# Patient Record
Sex: Female | Born: 1945 | Race: White | Hispanic: No | Marital: Married | Smoking: Never smoker
Health system: Southern US, Community
[De-identification: ages and names within clinical notes are randomized; demographics above are authoritative.]

## PROBLEM LIST (undated history)

## (undated) DIAGNOSIS — M810 Age-related osteoporosis without current pathological fracture: Secondary | ICD-10-CM

## (undated) DIAGNOSIS — C801 Malignant (primary) neoplasm, unspecified: Secondary | ICD-10-CM

## (undated) DIAGNOSIS — I959 Hypotension, unspecified: Secondary | ICD-10-CM

## (undated) DIAGNOSIS — Z9109 Other allergy status, other than to drugs and biological substances: Secondary | ICD-10-CM

## (undated) DIAGNOSIS — E538 Deficiency of other specified B group vitamins: Secondary | ICD-10-CM

## (undated) DIAGNOSIS — E785 Hyperlipidemia, unspecified: Secondary | ICD-10-CM

## (undated) DIAGNOSIS — E559 Vitamin D deficiency, unspecified: Secondary | ICD-10-CM

## (undated) HISTORY — PX: APPENDECTOMY: SHX54

## (undated) HISTORY — PX: TUBAL LIGATION: SHX77

---

## 1983-01-02 DIAGNOSIS — C801 Malignant (primary) neoplasm, unspecified: Secondary | ICD-10-CM

## 1983-01-02 HISTORY — PX: MASTECTOMY: SHX3

## 1983-01-02 HISTORY — DX: Malignant (primary) neoplasm, unspecified: C80.1

## 1984-01-02 HISTORY — PX: AUGMENTATION MAMMAPLASTY: SUR837

## 2003-10-25 ENCOUNTER — Ambulatory Visit: Payer: Self-pay | Admitting: Obstetrics and Gynecology

## 2004-11-13 ENCOUNTER — Ambulatory Visit: Payer: Self-pay | Admitting: Obstetrics and Gynecology

## 2005-11-07 ENCOUNTER — Emergency Department: Payer: Self-pay | Admitting: Emergency Medicine

## 2005-12-14 ENCOUNTER — Ambulatory Visit: Payer: Self-pay | Admitting: Obstetrics and Gynecology

## 2007-01-22 ENCOUNTER — Ambulatory Visit: Payer: Self-pay | Admitting: Obstetrics and Gynecology

## 2007-08-27 ENCOUNTER — Emergency Department: Payer: Self-pay | Admitting: Emergency Medicine

## 2008-03-11 ENCOUNTER — Ambulatory Visit: Payer: Self-pay | Admitting: Obstetrics and Gynecology

## 2009-03-14 ENCOUNTER — Ambulatory Visit: Payer: Self-pay | Admitting: Internal Medicine

## 2010-04-12 ENCOUNTER — Ambulatory Visit: Payer: Self-pay | Admitting: Internal Medicine

## 2011-05-21 ENCOUNTER — Ambulatory Visit: Payer: Self-pay | Admitting: Internal Medicine

## 2012-05-21 ENCOUNTER — Ambulatory Visit: Payer: Self-pay | Admitting: Internal Medicine

## 2012-06-19 ENCOUNTER — Ambulatory Visit: Payer: Self-pay | Admitting: Unknown Physician Specialty

## 2013-05-26 ENCOUNTER — Ambulatory Visit: Payer: Self-pay | Admitting: Internal Medicine

## 2014-05-06 ENCOUNTER — Other Ambulatory Visit: Payer: Self-pay

## 2014-05-06 DIAGNOSIS — Z1231 Encounter for screening mammogram for malignant neoplasm of breast: Secondary | ICD-10-CM

## 2014-06-01 ENCOUNTER — Ambulatory Visit
Admission: RE | Admit: 2014-06-01 | Discharge: 2014-06-01 | Disposition: A | Discharge status: Home / Self Care | Payer: Medicare Other | Source: Ambulatory Visit | Attending: Internal Medicine | Admitting: Internal Medicine

## 2014-06-01 ENCOUNTER — Other Ambulatory Visit: Payer: Self-pay

## 2014-06-01 DIAGNOSIS — Z1231 Encounter for screening mammogram for malignant neoplasm of breast: Secondary | ICD-10-CM | POA: Diagnosis not present

## 2014-06-01 HISTORY — DX: Malignant (primary) neoplasm, unspecified: C80.1

## 2014-12-24 IMAGING — MG MM CAD DIAGNOSTIC MAMMO
1 series · 8 of 8 positions shown · non-contrast
Comparison: 05/21/2011, 04/12/2010.

REASON FOR EXAM: HX BR CA YRLY IMPLANTS
COMMENTS:

PROCEDURE:     MAM - MAM DGTL DIAGNOSTIC MAMMO W/CAD  - May 21, 2012  [DATE]
RESULT:

[R CC · right · 8 of 13 slices shown]
[im 1/13]
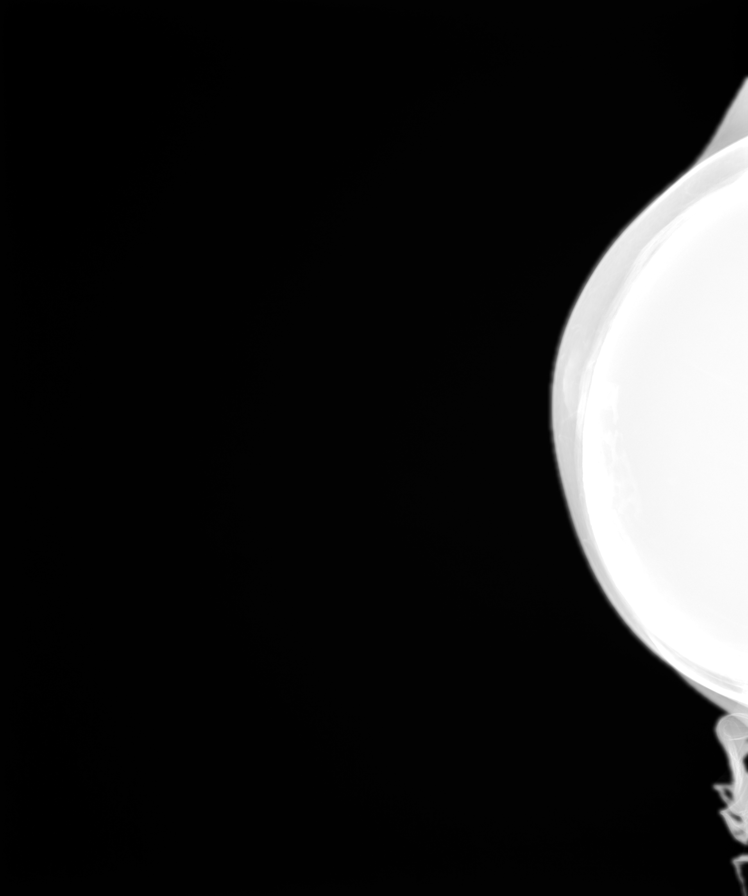
[im 2/13]
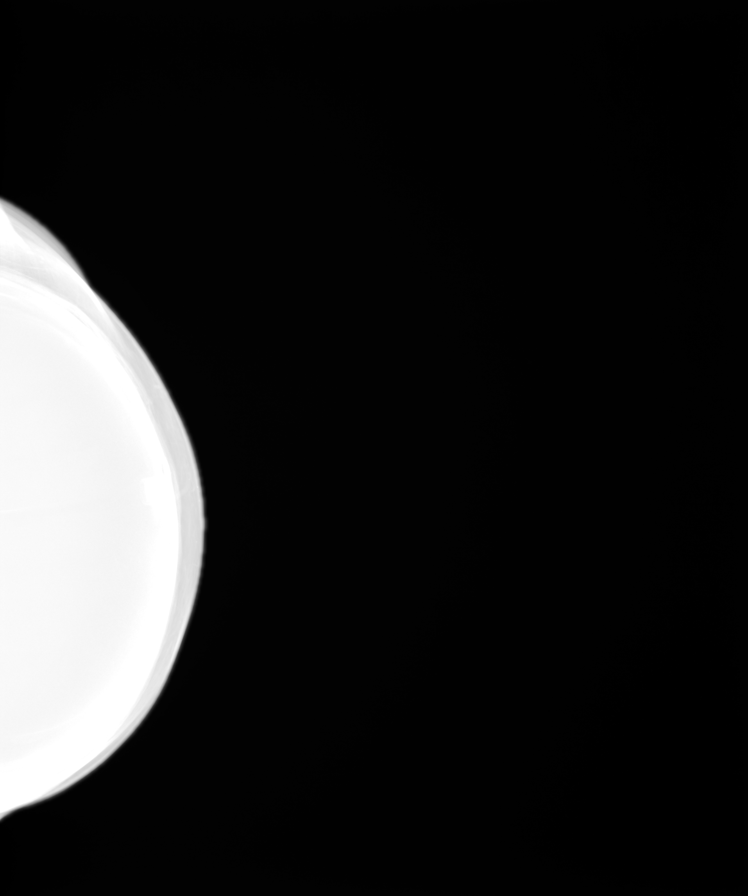
[im 4/13]
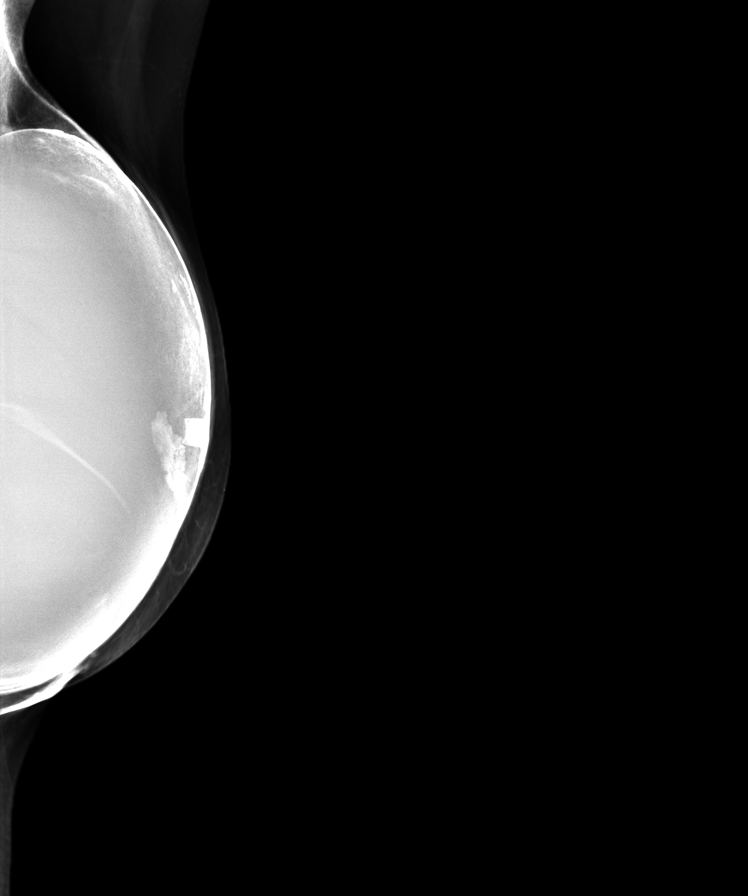
[im 6/13]
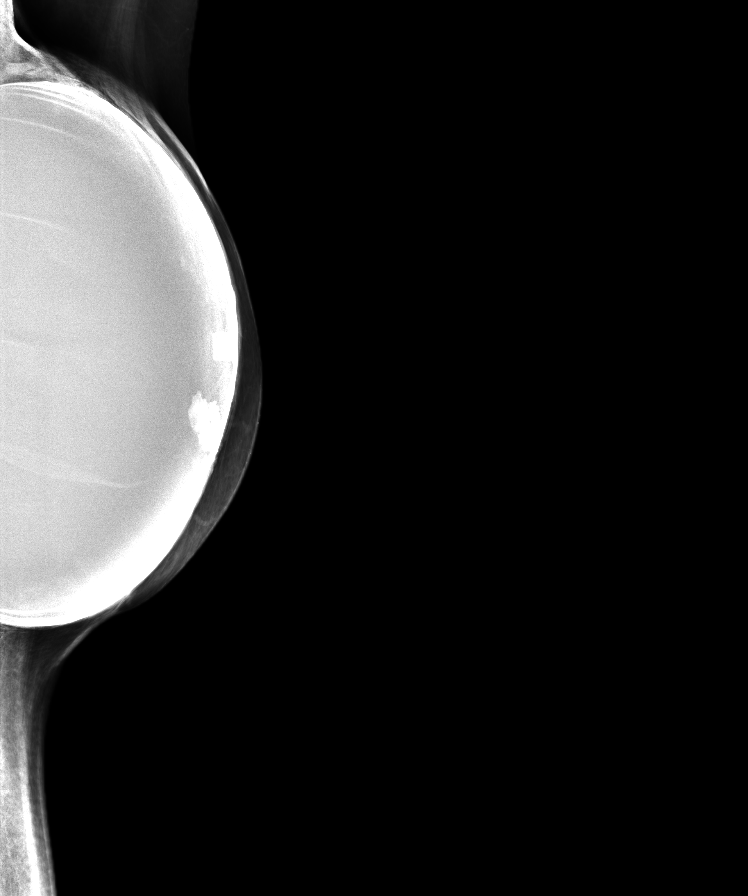
[im 7/13]
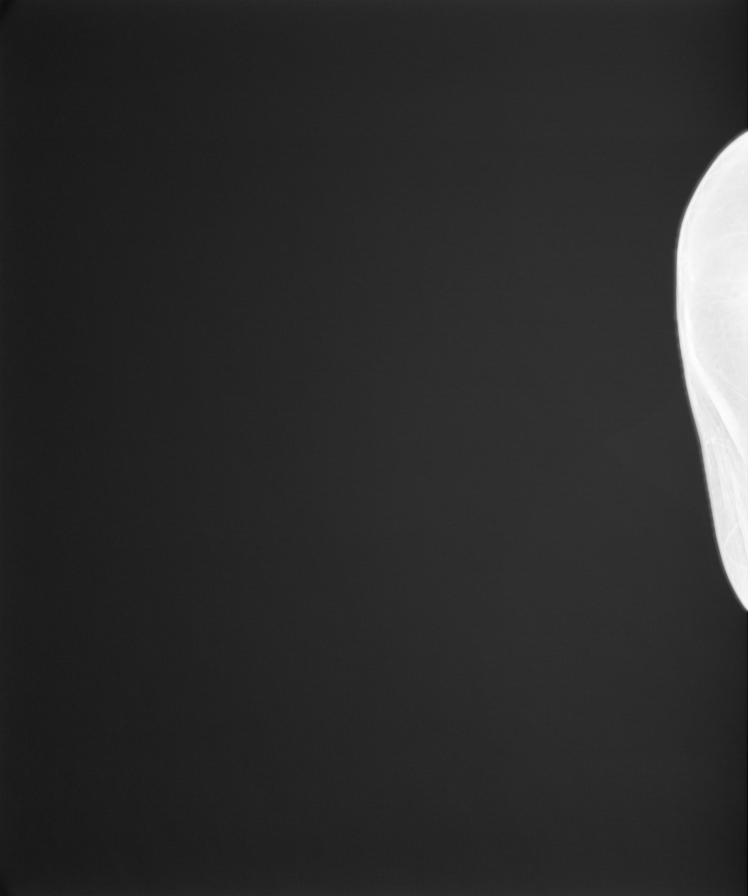
[im 9/13]
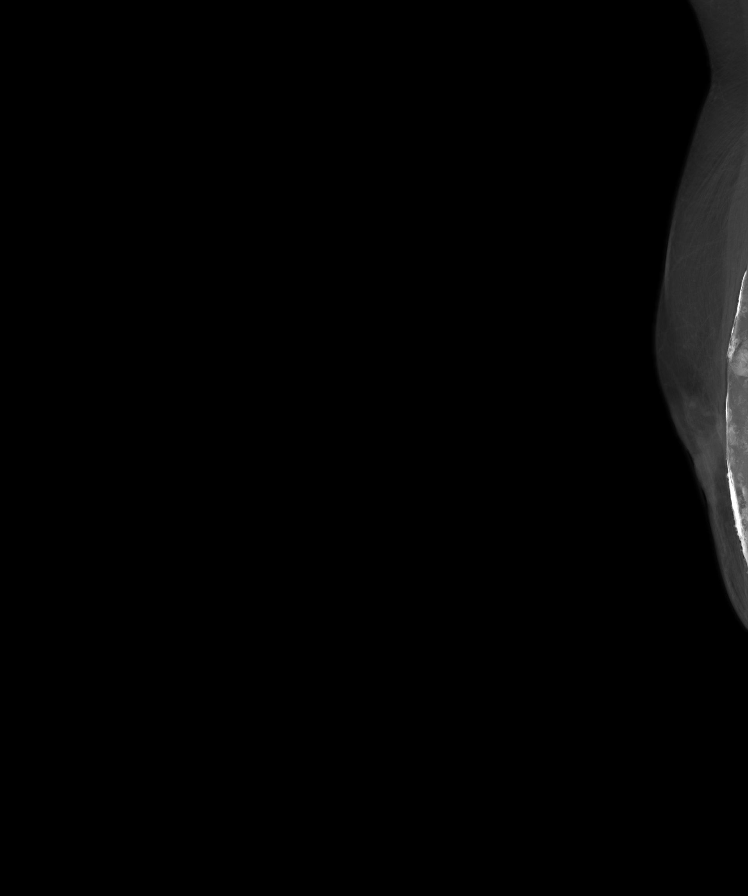
[im 11/13]
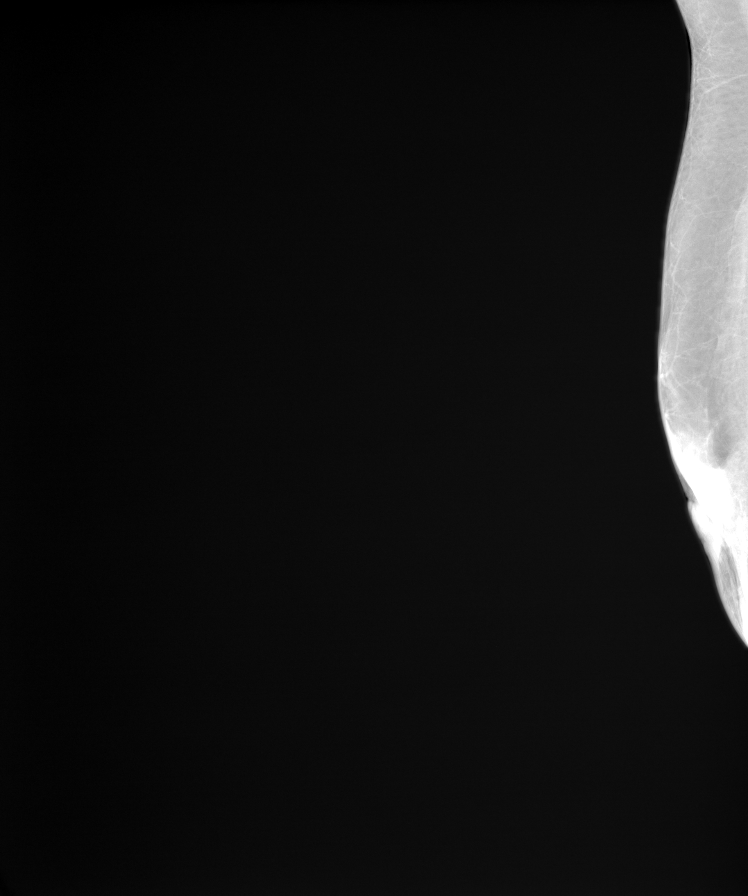
[im 13/13]
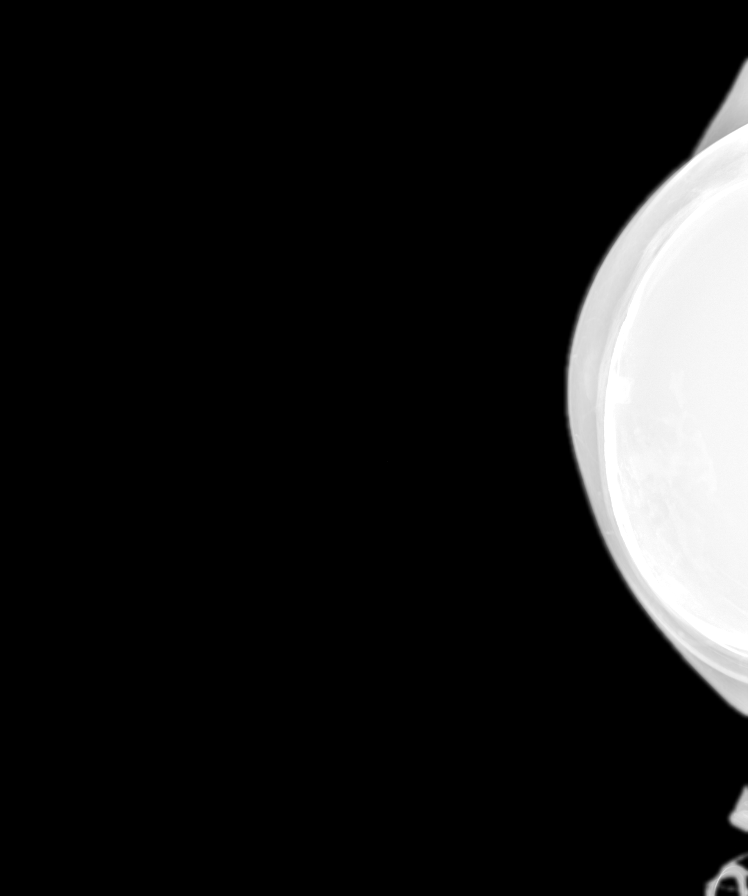

[8 of 8 positions shown; findings below may reference images not displayed]

FINDINGS: There are bilateral prosthetic breast implants, which lower the sensitivity
of mammography. There are calcifications along the capsular surface and
similar to prior studies. There is very little native breast tissue present.
The breast tissue is most entirely fatty. No suspicious masses or
calcifications are identified.
IMPRESSION: 1.     BI-RADS: Category 2 - Benign Findings.
2.     Recommend continued annual mammography.

[REDACTED]

BREAST COMPOSITION:  The breast composition is ALMOST ENTIRELY FATTY
(glandular tissue is less than 25%).

Thank you for this opportunity to contribute to the care of your patient.

A NEGATIVE MAMMOGRAM REPORT DOES NOT PRECLUDE BIOPSY OR OTHER EVALUATION OF
A CLINICALLY PALPABLE OR OTHERWISE SUSPICIOUS MASS OR LESION. BREAST CANCER
MAY NOT BE DETECTED BY MAMMOGRAPHY IN UP TO 10% OF CASES.

## 2015-05-06 ENCOUNTER — Other Ambulatory Visit: Payer: Self-pay | Admitting: Internal Medicine

## 2015-05-06 DIAGNOSIS — Z1231 Encounter for screening mammogram for malignant neoplasm of breast: Secondary | ICD-10-CM

## 2015-06-02 ENCOUNTER — Ambulatory Visit: Payer: Medicare Other

## 2015-06-09 ENCOUNTER — Other Ambulatory Visit: Payer: Self-pay | Admitting: Internal Medicine

## 2015-06-09 ENCOUNTER — Ambulatory Visit
Admission: RE | Admit: 2015-06-09 | Discharge: 2015-06-09 | Disposition: A | Discharge status: Home / Self Care | Payer: Medicare Other | Source: Ambulatory Visit | Attending: Internal Medicine | Admitting: Internal Medicine

## 2015-06-09 DIAGNOSIS — Z1231 Encounter for screening mammogram for malignant neoplasm of breast: Secondary | ICD-10-CM | POA: Diagnosis present

## 2016-05-23 ENCOUNTER — Other Ambulatory Visit: Payer: Self-pay | Admitting: Internal Medicine

## 2016-05-23 DIAGNOSIS — Z1231 Encounter for screening mammogram for malignant neoplasm of breast: Secondary | ICD-10-CM

## 2016-06-14 ENCOUNTER — Ambulatory Visit
Admission: RE | Admit: 2016-06-14 | Discharge: 2016-06-14 | Disposition: A | Discharge status: Home / Self Care | Payer: Medicare HMO | Source: Ambulatory Visit | Attending: Internal Medicine | Admitting: Internal Medicine

## 2016-06-14 DIAGNOSIS — Z1231 Encounter for screening mammogram for malignant neoplasm of breast: Secondary | ICD-10-CM | POA: Insufficient documentation

## 2017-06-19 ENCOUNTER — Other Ambulatory Visit: Payer: Self-pay | Admitting: Internal Medicine

## 2017-06-19 DIAGNOSIS — Z1231 Encounter for screening mammogram for malignant neoplasm of breast: Secondary | ICD-10-CM

## 2017-06-20 ENCOUNTER — Ambulatory Visit
Admission: RE | Admit: 2017-06-20 | Discharge: 2017-06-20 | Disposition: A | Discharge status: Home / Self Care | Payer: Medicare HMO | Source: Ambulatory Visit | Attending: Internal Medicine | Admitting: Internal Medicine

## 2017-06-20 DIAGNOSIS — Z1231 Encounter for screening mammogram for malignant neoplasm of breast: Secondary | ICD-10-CM | POA: Insufficient documentation

## 2018-05-22 ENCOUNTER — Other Ambulatory Visit: Payer: Self-pay | Admitting: Internal Medicine

## 2018-05-22 DIAGNOSIS — Z1231 Encounter for screening mammogram for malignant neoplasm of breast: Secondary | ICD-10-CM

## 2018-06-10 DIAGNOSIS — E559 Vitamin D deficiency, unspecified: Secondary | ICD-10-CM | POA: Insufficient documentation

## 2018-06-24 ENCOUNTER — Other Ambulatory Visit: Payer: Self-pay

## 2018-06-24 ENCOUNTER — Other Ambulatory Visit: Payer: Self-pay | Admitting: Internal Medicine

## 2018-06-24 ENCOUNTER — Ambulatory Visit
Admission: RE | Admit: 2018-06-24 | Discharge: 2018-06-24 | Disposition: A | Discharge status: Home / Self Care | Payer: Medicare HMO | Source: Ambulatory Visit | Attending: Internal Medicine | Admitting: Internal Medicine

## 2018-06-24 DIAGNOSIS — Z1231 Encounter for screening mammogram for malignant neoplasm of breast: Secondary | ICD-10-CM | POA: Diagnosis present

## 2018-10-27 ENCOUNTER — Ambulatory Visit: Payer: Self-pay | Admitting: Podiatry

## 2018-11-10 ENCOUNTER — Ambulatory Visit (INDEPENDENT_AMBULATORY_CARE_PROVIDER_SITE_OTHER): Payer: Medicare HMO | Admitting: Podiatry

## 2018-11-10 ENCOUNTER — Ambulatory Visit (INDEPENDENT_AMBULATORY_CARE_PROVIDER_SITE_OTHER): Payer: Medicare HMO

## 2018-11-10 ENCOUNTER — Encounter: Payer: Self-pay | Admitting: Podiatry

## 2018-11-10 ENCOUNTER — Other Ambulatory Visit: Payer: Self-pay

## 2018-11-10 VITALS — BP 125/70 | HR 66 | Resp 16

## 2018-11-10 DIAGNOSIS — M810 Age-related osteoporosis without current pathological fracture: Secondary | ICD-10-CM | POA: Insufficient documentation

## 2018-11-10 DIAGNOSIS — M778 Other enthesopathies, not elsewhere classified: Secondary | ICD-10-CM

## 2018-11-10 DIAGNOSIS — M2041 Other hammer toe(s) (acquired), right foot: Secondary | ICD-10-CM | POA: Diagnosis not present

## 2018-11-10 DIAGNOSIS — I959 Hypotension, unspecified: Secondary | ICD-10-CM | POA: Insufficient documentation

## 2018-11-10 DIAGNOSIS — Z9109 Other allergy status, other than to drugs and biological substances: Secondary | ICD-10-CM | POA: Insufficient documentation

## 2018-11-10 DIAGNOSIS — E785 Hyperlipidemia, unspecified: Secondary | ICD-10-CM | POA: Insufficient documentation

## 2018-11-10 DIAGNOSIS — M2011 Hallux valgus (acquired), right foot: Secondary | ICD-10-CM

## 2018-11-10 NOTE — Progress Notes (Signed)
  Subjective:  Patient ID: Alexis Howe, female    DOB: 10-Dec-1945,  MRN: [id number] HPI Chief Complaint  Patient presents with  . Foot Pain    Plantar forefoot right - aching x few months, 2nd toe oeverlapping big toe some, felt puffy-better today, open shoes help and wears a bandaid to separate the toes  . New Patient (Initial Visit)    73 y.o. female presents with the above complaint.   ROS: Denies fever chills nausea vomiting muscle aches pains calf pain back pain chest pain shortness of breath.  Past Medical History:  Diagnosis Date  . Cancer [facility]) 1985   left breast mastectomy with reconstruction   Past Surgical History:  Procedure Laterality Date  . AUGMENTATION MAMMAPLASTY Bilateral 1986   left complete mastectomy for breast ca. right subcutaneous mastectomy for preventative. (orginal nipple)  . MASTECTOMY Left 1985   mastectomy with reconstructive surgery    Current Outpatient Medications:  .  aspirin EC 81 MG tablet, Take 81 mg by mouth daily., Disp: , Rfl:  .  vitamin B-12 (CYANOCOBALAMIN) 1000 MCG tablet, Take 1,000 mcg by mouth daily., Disp: , Rfl:  .  lovastatin (MEVACOR) 40 MG tablet, Take 40 mg by mouth daily., Disp: , Rfl:  .  raloxifene (EVISTA) 60 MG tablet, Take 60 mg by mouth daily., Disp: , Rfl:  .  Vitamin D, Ergocalciferol, (DRISDOL) 1.25 MG (50000 UT) CAPS capsule, Take 50,000 Units by mouth once a week., Disp: , Rfl:   No Known Allergies Review of Systems Objective:   Vitals:   11/10/18 0949  BP: 125/70  Pulse: 66  Resp: 16    General: Well developed, nourished, in no acute distress, alert and oriented x3   Dermatological: Skin is warm, dry and supple bilateral. Nails x 10 are well maintained; remaining integument appears unremarkable at this time. There are no open sores, no preulcerative lesions, no rash or signs of infection present.  Vascular: Dorsalis Pedis artery and Posterior Tibial artery pedal pulses are 2/4 bilateral with immedate  capillary fill time. Pedal hair growth present. No varicosities and no lower extremity edema present bilateral.   Neruologic: Grossly intact via light touch bilateral. Vibratory intact via tuning fork bilateral. Protective threshold with Semmes Wienstein monofilament intact to all pedal sites bilateral. Patellar and Achilles deep tendon reflexes 2+ bilateral. No Babinski or clonus noted bilateral.   Musculoskeletal: No gross boney pedal deformities bilateral. No pain, crepitus, or limitation noted with foot and ankle range of motion bilateral. Muscular strength 5/5 in all groups tested bilateral.  Gait: Unassisted, Nonantalgic.    Radiographs:  Radiographs taken today demonstrate increase in the first intermetatarsal angle elongated second metatarsal and hammertoe deformity.  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    Plan: After sterile Betadine skin prep I injected 10 mg Kenalog 5 mg Marcaine around the second metatarsophalangeal joint of the right foot.  Discussed surgery in great detail today.  We will follow-up with her in the near future for surgical consult.     Jamerson Vonbargen T. [place], [place]

## 2018-12-22 ENCOUNTER — Ambulatory Visit: Payer: Medicare HMO | Admitting: Podiatry

## 2018-12-22 ENCOUNTER — Encounter: Payer: Self-pay | Admitting: Podiatry

## 2018-12-22 ENCOUNTER — Other Ambulatory Visit: Payer: Self-pay

## 2018-12-22 DIAGNOSIS — M2041 Other hammer toe(s) (acquired), right foot: Secondary | ICD-10-CM

## 2018-12-22 DIAGNOSIS — M21961 Unspecified acquired deformity of right lower leg: Secondary | ICD-10-CM

## 2018-12-22 DIAGNOSIS — M2011 Hallux valgus (acquired), right foot: Secondary | ICD-10-CM | POA: Diagnosis not present

## 2018-12-22 NOTE — Progress Notes (Signed)
She presents today for follow-up of capsulitis second metatarsophalangeal joint of the right foot.  States that is feeling a lot better but I think is going to have to have surgery to get it fixed permanently.  She denies history medical history changes med changes.  ROS: Denies fever chills nausea vomiting muscle aches pains calf pain back pain chest pain shortness of breath.  Objective: Vital signs are stable she is alert and oriented x3.  Hallux abductovalgus deformity of the right foot is resulting in hammertoe deformity of the right foot second toe.  Neurologic sensorium is intact deep tendon reflexes are intact muscle strength is normal and symmetrical.  Dermatologic evaluation is normal.  Assessment: Pain in limb secondary to hallux valgus deformity right contracted second metatarsophalangeal joint.  Plan: Discussed etiology pathology conservative surgical therapies.  Discussed Austin bunionectomy and second metatarsal osteotomy and hammertoe repair with screw and or pin second toe right foot.  Answered all the questions regarding this procedure to the best my ability in layman's terms she understood was amenable to it and signed all 3 pages of the consent form.  Provided her with information regarding the surgery center provided her with information regarding anesthesia group we also provide her with instructions for the morning of surgery.

## 2018-12-22 NOTE — Patient Instructions (Signed)
Pre-Operative Instructions  Congratulations, you have decided to take an important step towards improving your quality of life.  You can be assured that the doctors and staff at [facility] will be with you every step of the way.  Here are some important things you should know:  1. Plan to be at the surgery center/hospital at least 1 (one) hour prior to your scheduled time, unless otherwise directed by the surgical center/hospital staff.  You must have a responsible adult accompany you, remain during the surgery and drive you home.  Make sure you have directions to the surgical center/hospital to ensure you arrive on time. 2. If you are having surgery at [facility] or [facility] hospitals, you will need a copy of your medical history and physical form from your family physician within one month prior to the date of surgery. We will give you a form for your primary physician to complete.  3. We make every effort to accommodate the date you request for surgery.  However, there are times where surgery dates or times have to be moved.  We will contact you as soon as possible if a change in schedule is required.   4. No aspirin/ibuprofen for one week before surgery.  If you are on aspirin, any non-steroidal anti-inflammatory medications (Mobic, Aleve, Ibuprofen) should not be taken seven (7) days prior to your surgery.  You make take Tylenol for pain prior to surgery.  5. Medications - If you are taking daily heart and blood pressure medications, seizure, reflux, allergy, asthma, anxiety, pain or diabetes medications, make sure you notify the surgery center/hospital before the day of surgery so they can tell you which medications you should take or avoid the day of surgery. 6. No food or drink after midnight the night before surgery unless directed otherwise by surgical center/hospital staff. 7. No alcoholic beverages 24-hours prior to surgery.  No smoking 24-hours prior or 24-hours after  surgery. 8. Wear loose pants or shorts. They should be loose enough to fit over bandages, boots, and casts. 9. Don't wear slip-on shoes. Sneakers are preferred. 10. Bring your boot with you to the surgery center/hospital.  Also bring crutches or a walker if your physician has prescribed it for you.  If you do not have this equipment, it will be provided for you after surgery. 11. If you have not been contacted by the surgery center/hospital by the day before your surgery, call to confirm the date and time of your surgery. 12. Leave-time from work may vary depending on the type of surgery you have.  Appropriate arrangements should be made prior to surgery with your employer. 13. Prescriptions will be provided immediately following surgery by your doctor.  Fill these as soon as possible after surgery and take the medication as directed. Pain medications will not be refilled on weekends and must be approved by the doctor. 14. Remove nail polish on the operative foot and avoid getting pedicures prior to surgery. 15. Wash the night before surgery.  The night before surgery wash the foot and leg well with water and the antibacterial soap provided. Be sure to pay special attention to beneath the toenails and in between the toes.  Wash for at least three (3) minutes. Rinse thoroughly with water and dry well with a towel.  Perform this wash unless told not to do so by your physician.  Enclosed: 1 Ice pack (please put in freezer the night before surgery)   1 Hibiclens skin cleaner     Pre-op instructions  If you have any questions regarding the instructions, please do not hesitate to call our office.  [place]: [address], [place], [place] [address] -- [phone]  [place]: [address] [place], [place] [address] -- [phone]  [place]: [address], [place], [place] [address] -- [phone]   Website: [email or web address] 

## 2018-12-23 ENCOUNTER — Telehealth: Payer: Self-pay | Admitting: Podiatry

## 2018-12-23 NOTE — Telephone Encounter (Signed)
Pt called to say she was getting ready to leave and didn't want me to try calling since she wouldn't be home. I asked if she wanted to go ahead and schedule her surgery while we were on the phone. I scheduled the pt for surgery on 01/23/2019. I told her she could go ahead register online with the surgical center and that they would call a day or two prior to let her know what time to arrive for surgery. Told pt to call with any questions between now and her surgery.

## 2018-12-23 NOTE — Telephone Encounter (Signed)
I'm calling to schedule surgery on my right foot with Dr. Milinda Pointer.

## 2019-01-06 ENCOUNTER — Telehealth: Payer: Self-pay | Admitting: Podiatry

## 2019-01-06 NOTE — Telephone Encounter (Signed)
DOS: 01/23/2019  SURGICAL PROCEDURES: Alexis Howe [id number]([id number]), Metatarsal Osteotomy 2nd [phone]), and Hammertoe Repair 2nd [phone]).  [employer] Medicare Effective 01/02/2016 -   There is no deductible on this plan. Out of Pocket is $7,000 with $0 met and $7,000 to meet. CoInsurance is 100% / 0% Copay is $375.  Per Automated Eligibility no referral is required. Call ref# [id number].  Per [name] no prior authorization is required. Call ref# [id number].

## 2019-01-22 ENCOUNTER — Other Ambulatory Visit: Payer: Self-pay | Admitting: Podiatry

## 2019-01-22 MED ORDER — OXYCODONE-ACETAMINOPHEN 10-325 MG PO TABS: 1.0000 | ORAL_TABLET | Freq: Three times a day (TID) | ORAL | 0 refills | Status: AC | PRN

## 2019-01-22 MED ORDER — CEPHALEXIN 500 MG PO CAPS: 500.0000 mg | ORAL_CAPSULE | Freq: Three times a day (TID) | ORAL | 0 refills | Status: DC

## 2019-01-22 MED ORDER — ONDANSETRON HCL 4 MG PO TABS: 4.0000 mg | ORAL_TABLET | Freq: Three times a day (TID) | ORAL | 0 refills | Status: DC | PRN

## 2019-01-23 ENCOUNTER — Encounter: Payer: Self-pay | Admitting: Podiatry

## 2019-01-23 DIAGNOSIS — M21541 Acquired clubfoot, right foot: Secondary | ICD-10-CM

## 2019-01-23 DIAGNOSIS — M2011 Hallux valgus (acquired), right foot: Secondary | ICD-10-CM

## 2019-01-23 DIAGNOSIS — M2041 Other hammer toe(s) (acquired), right foot: Secondary | ICD-10-CM

## 2019-01-28 ENCOUNTER — Ambulatory Visit (INDEPENDENT_AMBULATORY_CARE_PROVIDER_SITE_OTHER): Payer: Medicare HMO

## 2019-01-28 ENCOUNTER — Other Ambulatory Visit: Payer: Self-pay

## 2019-01-28 ENCOUNTER — Ambulatory Visit (INDEPENDENT_AMBULATORY_CARE_PROVIDER_SITE_OTHER): Payer: Medicare HMO | Admitting: Podiatry

## 2019-01-28 ENCOUNTER — Encounter: Payer: Medicare HMO | Admitting: Podiatry

## 2019-01-28 ENCOUNTER — Encounter: Payer: Self-pay | Admitting: Podiatry

## 2019-01-28 VITALS — BP 121/68 | HR 76 | Temp 98.2°F

## 2019-01-28 DIAGNOSIS — Z9889 Other specified postprocedural states: Secondary | ICD-10-CM

## 2019-01-28 DIAGNOSIS — M2011 Hallux valgus (acquired), right foot: Secondary | ICD-10-CM

## 2019-01-28 NOTE — Progress Notes (Signed)
She presents today postop visit date of surgery January 23, 2019 status post Liane Comber bunionectomy second metatarsal osteotomy and hammertoe repair second right.  She denies fever chills nausea vomiting muscle aches pains states she is doing very well.  Has had hardly any pain continues to take ibuprofen and Tylenol together.  Objective: Vital signs are stable she alert oriented x3 presents today in her cam walker dry sterile dressing intact was removed demonstrates minimal edema some ecchymosis no erythema cellulitis drainage or odor sutures are intact margins well coapted great range of motion of the first metatarsophalangeal joint.  Radiographs taken today demonstrate a capital osteotomy of the first and second metatarsals with screw fixation as well as a prescription pin retained to the second toe just to the level of the metatarsophalangeal joint sitting in a vertical or rectus position.  Assessment: Well-healing surgical foot.  Plan: Redressed today dressed a compressive dressing encouraged to continue current therapies and I will follow-up with her in 1 week.

## 2019-02-04 ENCOUNTER — Other Ambulatory Visit: Payer: Self-pay

## 2019-02-04 ENCOUNTER — Ambulatory Visit (INDEPENDENT_AMBULATORY_CARE_PROVIDER_SITE_OTHER): Payer: Medicare HMO | Admitting: Podiatry

## 2019-02-04 ENCOUNTER — Encounter: Payer: Self-pay | Admitting: Podiatry

## 2019-02-04 DIAGNOSIS — M21961 Unspecified acquired deformity of right lower leg: Secondary | ICD-10-CM

## 2019-02-04 DIAGNOSIS — Z9889 Other specified postprocedural states: Secondary | ICD-10-CM

## 2019-02-04 DIAGNOSIS — M2041 Other hammer toe(s) (acquired), right foot: Secondary | ICD-10-CM

## 2019-02-04 DIAGNOSIS — M2011 Hallux valgus (acquired), right foot: Secondary | ICD-10-CM

## 2019-02-04 NOTE — Progress Notes (Signed)
She presents today date of surgery January 23, 2019 status post Liane Comber bunionectomy right leg metatarsal osteotomy right hammertoe repair second right she states that is doing great my toes are great I have no pain whatsoever.  Objective: Vital signs are stable alert oriented x3.  There is no erythema just mild edema no cellulitis drainage or odor sutures are intact we will leave the sutures in for about another week.  There is a mild elevatus of the second toe which I would like to see come down.  Assessment: She is doing very well for nearly 2 weeks out at this point.  K wires still in the toe were going to leave it in for a while longer.  Plan: Redressed the foot today dressed a compressive dressing follow-up with her in 1 week for suture removal.

## 2019-02-11 ENCOUNTER — Other Ambulatory Visit: Payer: Self-pay

## 2019-02-11 ENCOUNTER — Ambulatory Visit (INDEPENDENT_AMBULATORY_CARE_PROVIDER_SITE_OTHER): Payer: Medicare HMO | Admitting: Podiatry

## 2019-02-11 ENCOUNTER — Ambulatory Visit (INDEPENDENT_AMBULATORY_CARE_PROVIDER_SITE_OTHER): Payer: Medicare HMO

## 2019-02-11 DIAGNOSIS — M2041 Other hammer toe(s) (acquired), right foot: Secondary | ICD-10-CM | POA: Diagnosis not present

## 2019-02-11 DIAGNOSIS — Z9889 Other specified postprocedural states: Secondary | ICD-10-CM

## 2019-02-11 DIAGNOSIS — M21961 Unspecified acquired deformity of right lower leg: Secondary | ICD-10-CM | POA: Diagnosis not present

## 2019-02-11 DIAGNOSIS — M2011 Hallux valgus (acquired), right foot: Secondary | ICD-10-CM

## 2019-02-11 NOTE — Progress Notes (Signed)
She presents today for her third postop visit date of surgery 01/23/1999 status post Alexis Howe bunionectomy second metatarsal osteotomy hammertoe repair with pin second digit right foot states that I am doing very well nothing has even hurt I been keeping it elevated is much as possible.  I enjoyed this move much better than it did the big boot.  Objective: Vital signs are stable she is alert and oriented x3.  K wires second digit is intact toe is slightly elevated she has good range of motion of the first metatarsophalangeal joint sutures are intact margins are well coapted particularly overlying the incision to that first metatarsal overlying the second metatarsal little bit less healed so I think were going to wait a little longer.  Assessment: Well-healing surgical foot.  Plan: Follow-up with her in 1 week redressed today dressed a compressive dressing removed sutures overlying the second metatarsophalangeal joint at that time.

## 2019-02-18 ENCOUNTER — Encounter: Payer: Medicare HMO | Admitting: Podiatry

## 2019-02-18 ENCOUNTER — Ambulatory Visit: Payer: Medicare HMO

## 2019-02-18 ENCOUNTER — Other Ambulatory Visit: Payer: Self-pay

## 2019-02-18 ENCOUNTER — Encounter: Payer: Self-pay | Admitting: Podiatry

## 2019-02-18 ENCOUNTER — Ambulatory Visit (INDEPENDENT_AMBULATORY_CARE_PROVIDER_SITE_OTHER): Payer: Medicare HMO | Admitting: Podiatry

## 2019-02-18 DIAGNOSIS — M2011 Hallux valgus (acquired), right foot: Secondary | ICD-10-CM

## 2019-02-18 DIAGNOSIS — M21961 Unspecified acquired deformity of right lower leg: Secondary | ICD-10-CM

## 2019-02-18 DIAGNOSIS — Z9889 Other specified postprocedural states: Secondary | ICD-10-CM

## 2019-02-18 DIAGNOSIS — M2041 Other hammer toe(s) (acquired), right foot: Secondary | ICD-10-CM

## 2019-02-18 NOTE — Progress Notes (Signed)
She presents today for postop visit date of surgery January 23, 2019 [place] bunionectomy metatarsal osteotomy hammertoe repair second with wire.  She states that she is doing great no pain.  Objective: Vital signs are stable she is alert and oriented x3.  There is no erythema mild edema no cellulitis drainage or odor she has good positioning of the toe as well as the K wire.  Good range of motion of the first metatarsophalangeal joint.  Assessment: Well-healing surgical foot.  Plan: We will follow-up with her in 2 weeks at which time Dr. Amalia Hailey or Dr. Posey Pronto will remove the K wire from the toe.  Radiographs to be taken and I will see the patient 2 weeks from that date.

## 2019-02-25 ENCOUNTER — Encounter: Payer: Medicare HMO | Admitting: Podiatry

## 2019-03-04 ENCOUNTER — Encounter: Payer: Medicare HMO | Admitting: Podiatry

## 2019-03-05 ENCOUNTER — Other Ambulatory Visit: Payer: Self-pay

## 2019-03-05 ENCOUNTER — Ambulatory Visit (INDEPENDENT_AMBULATORY_CARE_PROVIDER_SITE_OTHER): Payer: Medicare HMO | Admitting: Podiatry

## 2019-03-05 ENCOUNTER — Ambulatory Visit (INDEPENDENT_AMBULATORY_CARE_PROVIDER_SITE_OTHER): Payer: Medicare HMO

## 2019-03-05 DIAGNOSIS — M2011 Hallux valgus (acquired), right foot: Secondary | ICD-10-CM

## 2019-03-05 DIAGNOSIS — Z9889 Other specified postprocedural states: Secondary | ICD-10-CM

## 2019-03-05 DIAGNOSIS — M2041 Other hammer toe(s) (acquired), right foot: Secondary | ICD-10-CM

## 2019-03-06 ENCOUNTER — Encounter: Payer: Self-pay | Admitting: Podiatry

## 2019-03-06 NOTE — Progress Notes (Signed)
  Subjective:  Patient ID: Alexis Howe, female    DOB: 1945/11/16,  MRN: [id number]  Chief Complaint  Patient presents with  . Routine Post Op    pt is here for a routine post op of the right foot 2nd toe, pt shows no signs of infection, Alexis Howe has been removed, pt has no other comments or concerns    DOS: January 23, 2019 Procedure: [place] bunionectomy with metatarsal osteotomy and hammertoe repair with wire  74 y.o. female returns for post-op check.  Patient presents today for wire removal.  Patient states that she is doing really well.  She denies any other acute complaints.  She would like to have the wire removed.  Overall patient is in good spirit and is happy with the surgery.  Review of Systems: Negative except as noted in the HPI. Denies N/V/F/Ch.  Past Medical History:  Diagnosis Date  . Cancer [facility]) 1985   left breast mastectomy with reconstruction    Current Outpatient Medications:  .  aspirin EC 81 MG tablet, Take 81 mg by mouth daily., Disp: , Rfl:  .  lovastatin (MEVACOR) 40 MG tablet, Take 40 mg by mouth daily., Disp: , Rfl:  .  ondansetron (ZOFRAN) 4 MG tablet, Take 1 tablet (4 mg total) by mouth every 8 (eight) hours as needed., Disp: 20 tablet, Rfl: 0 .  raloxifene (EVISTA) 60 MG tablet, Take 60 mg by mouth daily., Disp: , Rfl:  .  vitamin B-12 (CYANOCOBALAMIN) 1000 MCG tablet, Take 1,000 mcg by mouth daily., Disp: , Rfl:  .  Vitamin D, Ergocalciferol, (DRISDOL) 1.25 MG (50000 UT) CAPS capsule, Take 50,000 Units by mouth once a week., Disp: , Rfl:   Social History   Tobacco Use  Smoking Status Never Smoker  Smokeless Tobacco Never Used    No Known Allergies Objective:  There were no vitals filed for this visit. There is no height or weight on file to calculate BMI. Constitutional Well developed. Well nourished.  Vascular Foot warm and well perfused. Capillary refill normal to all digits.   Neurologic Normal speech. Oriented to person, place, and  time. Epicritic sensation to light touch grossly present bilaterally.  Dermatologic Skin healing well without signs of infection. Skin edges well coapted without signs of infection.  Orthopedic: Tenderness to palpation noted about the surgical site.   Radiographs: 3 views of skeletally mature adult foot hardware is intact without any signs of breaking or loosening.  Good correction and alignment noted Assessment:   1. Hav (hallux abducto valgus), right   2. Hammer toe of right foot   3. S/P foot surgery, right    Plan:  Patient was evaluated and treated and all questions answered.  S/p foot surgery right -Progressing as expected post-operatively. -XR: See above -WB Status: Weightbearing as tolerated in regular shoes.  Patient can start transitioning from surgical shoe to regular sneakers -Sutures: None. -Medications: None -Foot redressed. -She will be scheduled to see Dr. Milinda Pointer for final clearance and discharge.  No follow-ups on file.

## 2019-03-25 ENCOUNTER — Other Ambulatory Visit: Payer: Self-pay

## 2019-03-25 ENCOUNTER — Ambulatory Visit (INDEPENDENT_AMBULATORY_CARE_PROVIDER_SITE_OTHER): Payer: Medicare HMO | Admitting: Podiatry

## 2019-03-25 ENCOUNTER — Encounter: Payer: Self-pay | Admitting: Podiatry

## 2019-03-25 ENCOUNTER — Ambulatory Visit (INDEPENDENT_AMBULATORY_CARE_PROVIDER_SITE_OTHER): Payer: Medicare HMO

## 2019-03-25 VITALS — Temp 96.7°F

## 2019-03-25 DIAGNOSIS — M2011 Hallux valgus (acquired), right foot: Secondary | ICD-10-CM

## 2019-03-25 DIAGNOSIS — M2041 Other hammer toe(s) (acquired), right foot: Secondary | ICD-10-CM | POA: Diagnosis not present

## 2019-03-25 DIAGNOSIS — Z9889 Other specified postprocedural states: Secondary | ICD-10-CM

## 2019-03-25 NOTE — Progress Notes (Signed)
Date of surgery January 23, 2019 status post Liane Comber bunionectomy second metatarsal osteotomy and hammertoe repair all in the right foot.  She states that is doing good but still swells some I could not wear the compression anklet because of the swelling because of my leg and the pain but all in all I think is doing much better have written a regular shoe now.  Objective: Vital signs are stable she is alert and oriented x3.  There is no erythema it is some mild edema no cellulitis drainage or odor she has some lateral deviation of the hallux more than likely because there is no juxtaposition of the second toe because is sitting a little high.  Scar tissue was contracting the toe dorsally.  Radiographs demonstrate well healing osteotomies with internal fixation in good position.  Assessment: Well-healing surgical foot some current contracture of the scar tissue is present.  Plan: At this point dispensed Darco braces to wear at nighttime to help with the stretching of the soft tissues did discuss briefly possible surgical options.

## 2019-04-27 ENCOUNTER — Other Ambulatory Visit: Payer: Self-pay

## 2019-04-27 ENCOUNTER — Ambulatory Visit: Payer: Medicare HMO

## 2019-04-27 ENCOUNTER — Encounter: Payer: Self-pay | Admitting: Podiatry

## 2019-04-27 ENCOUNTER — Ambulatory Visit (INDEPENDENT_AMBULATORY_CARE_PROVIDER_SITE_OTHER): Payer: Medicare HMO | Admitting: Podiatry

## 2019-04-27 DIAGNOSIS — M2011 Hallux valgus (acquired), right foot: Secondary | ICD-10-CM

## 2019-04-27 DIAGNOSIS — M2041 Other hammer toe(s) (acquired), right foot: Secondary | ICD-10-CM | POA: Diagnosis not present

## 2019-04-27 DIAGNOSIS — L910 Hypertrophic scar: Secondary | ICD-10-CM | POA: Diagnosis not present

## 2019-04-27 DIAGNOSIS — Z9889 Other specified postprocedural states: Secondary | ICD-10-CM

## 2019-04-27 NOTE — Progress Notes (Signed)
She presents today date of surgery January 23, 2019 status post Liane Comber bunionectomy second metatarsal osteotomy and hammertoe repair second with pin.  At this point states that is just the incision on the top of the second metatarsophalangeal joint that hurts.  She states that she continues to wear her brace at nighttime to hold the second toe down.  Objective: Vital signs are stable alert oriented x3 there is no erythema edema cellulitis drainage odor she has a thickened scar along the incision site.  It is contracting and drawing the toe back.  Assessment: Well-healing surgical foot other than a painful scar overlying the second metatarsophalangeal joint.  Plan: I injected the scars subcutaneously today 20 mg Kenalog 5 mg Marcaine.  Follow-up with her in about 6 weeks to see how this is doing we did discuss possibly needing to get back to the surgery center and opening this back up.

## 2019-06-08 ENCOUNTER — Other Ambulatory Visit: Payer: Self-pay

## 2019-06-08 ENCOUNTER — Ambulatory Visit (INDEPENDENT_AMBULATORY_CARE_PROVIDER_SITE_OTHER): Payer: Medicare HMO | Admitting: Podiatry

## 2019-06-08 DIAGNOSIS — M2041 Other hammer toe(s) (acquired), right foot: Secondary | ICD-10-CM

## 2019-06-08 DIAGNOSIS — Z9889 Other specified postprocedural states: Secondary | ICD-10-CM

## 2019-06-08 DIAGNOSIS — M2011 Hallux valgus (acquired), right foot: Secondary | ICD-10-CM

## 2019-06-08 DIAGNOSIS — M6240 Contracture of muscle, unspecified site: Secondary | ICD-10-CM

## 2019-06-08 NOTE — Progress Notes (Signed)
She presents today for follow-up of her scar tissue contracture dorsally at the second metatarsophalangeal joint dorsiflexion of the second toe.  States that think is doing a lot better but the tissue still tight.  I think the shot really helped.  Objective: Vital signs are stable she is alert oriented x3 pain is nonreproducible at this point.  Though she still has contracture of the tissue dorsally.  Assessment: Status post scar tissue causing rotation of the toe and contracture of the toe at the metatarsophalangeal joint second right.  Plan: At this point I encouraged her to continue range of motion therapy massage therapy to the tissues.  Also placed her in a digital corrector to wear during the daytime as well as the one she wears at nighttime.  I will follow-up with her in 2 months which time we will discuss surgical correction if necessary.

## 2019-06-11 ENCOUNTER — Other Ambulatory Visit: Payer: Self-pay | Admitting: Internal Medicine

## 2019-06-11 DIAGNOSIS — Z1231 Encounter for screening mammogram for malignant neoplasm of breast: Secondary | ICD-10-CM

## 2019-06-11 DIAGNOSIS — N179 Acute kidney failure, unspecified: Secondary | ICD-10-CM

## 2019-06-16 ENCOUNTER — Other Ambulatory Visit: Payer: Self-pay

## 2019-06-16 ENCOUNTER — Ambulatory Visit
Admission: RE | Admit: 2019-06-16 | Discharge: 2019-06-16 | Disposition: A | Discharge status: Home / Self Care | Payer: Medicare HMO | Source: Ambulatory Visit | Attending: Internal Medicine | Admitting: Internal Medicine

## 2019-06-16 DIAGNOSIS — N179 Acute kidney failure, unspecified: Secondary | ICD-10-CM

## 2019-07-14 ENCOUNTER — Ambulatory Visit
Admission: RE | Admit: 2019-07-14 | Discharge: 2019-07-14 | Disposition: A | Discharge status: Home / Self Care | Payer: Medicare HMO | Source: Ambulatory Visit | Attending: Internal Medicine | Admitting: Internal Medicine

## 2019-07-14 DIAGNOSIS — Z1231 Encounter for screening mammogram for malignant neoplasm of breast: Secondary | ICD-10-CM | POA: Diagnosis not present

## 2019-08-12 ENCOUNTER — Encounter: Payer: Medicare HMO | Admitting: Podiatry

## 2019-08-19 ENCOUNTER — Ambulatory Visit (INDEPENDENT_AMBULATORY_CARE_PROVIDER_SITE_OTHER): Payer: Medicare HMO | Admitting: Podiatry

## 2019-08-19 ENCOUNTER — Encounter: Payer: Self-pay | Admitting: Podiatry

## 2019-08-19 ENCOUNTER — Other Ambulatory Visit: Payer: Self-pay

## 2019-08-19 DIAGNOSIS — M2041 Other hammer toe(s) (acquired), right foot: Secondary | ICD-10-CM

## 2019-08-19 DIAGNOSIS — M2011 Hallux valgus (acquired), right foot: Secondary | ICD-10-CM

## 2019-08-19 DIAGNOSIS — L910 Hypertrophic scar: Secondary | ICD-10-CM | POA: Diagnosis not present

## 2019-08-19 DIAGNOSIS — Z9889 Other specified postprocedural states: Secondary | ICD-10-CM

## 2019-08-20 NOTE — Progress Notes (Signed)
She presents today for follow-up of a scar to the forefoot second toe right status post second met osteotomy and bunion deformity with bunion repair.  Objective: Vital signs are stable she is alert and oriented x3. Pulses are palpable. There is no erythema edema cellulitis drainage or odor second toe has contracted even more from previous evaluations I think it is just plainly due to the scar on physical exam but if we do not get the second toe to sit down it will not oppose the first and I bunion repair will be negligible at that point.  Assessment: Contracted scar contracted second metatarsophalangeal joint. There is some varus rotation of the toe.  Plan: Discussed etiology pathology and surgical therapies at this point in time I recommended a hammertoe type repair with excision of scar release of the second metatarsophalangeal joint may possibly have to perform another osteotomy she understands this and is amendable to it we have got to get that second toe to sit down in good position.  Consented her today for a release of the second metatarsophalangeal joint and excision of scar tissue. We discussed possible postop complications which may include but not limited to postop pain bleeding swelling infection recurrence need for further surgery overcorrection under correction loss of digit loss of limb loss of life. She signed all 3 pages of consent form I will follow-up with her in the near future for surgical repair.

## 2019-09-30 ENCOUNTER — Telehealth: Payer: Self-pay

## 2019-09-30 NOTE — Telephone Encounter (Signed)
DOS 10/16/2019  EXCISION OF PAINFUL SCAR RT - 29574 OR 73403 HAMMERTOE REPAIR 2ND RT - 70964  [employer] MEDICARE EFFECTIVE DATE - 01/02/2016  PLAN DEDUCTIBLE - $0.00 OUT OF POCKET - $7000.00 W/ $3838.18 REMAINING COPAY $375.00 COINSURANCE - 100%  PER AUTOMATED SYSTEM NO PRECERT IS REQUIRED FOR CPT 28285, 13131 OR 40375.  CALL REF # [id number]

## 2019-10-14 ENCOUNTER — Other Ambulatory Visit: Payer: Self-pay | Admitting: Podiatry

## 2019-10-14 MED ORDER — CEPHALEXIN 500 MG PO CAPS
500.0000 mg | ORAL_CAPSULE | Freq: Three times a day (TID) | ORAL | 0 refills | Status: DC
Start: 2019-10-14 — End: 2019-11-11

## 2019-10-14 MED ORDER — ONDANSETRON HCL 4 MG PO TABS: 4.0000 mg | ORAL_TABLET | Freq: Three times a day (TID) | ORAL | 0 refills | Status: DC | PRN

## 2019-10-14 MED ORDER — HYDROCODONE-ACETAMINOPHEN 10-325 MG PO TABS: 1.0000 | ORAL_TABLET | Freq: Four times a day (QID) | ORAL | 0 refills | Status: AC | PRN

## 2019-10-16 DIAGNOSIS — L910 Hypertrophic scar: Secondary | ICD-10-CM

## 2019-10-16 DIAGNOSIS — M2041 Other hammer toe(s) (acquired), right foot: Secondary | ICD-10-CM

## 2019-10-21 ENCOUNTER — Ambulatory Visit (INDEPENDENT_AMBULATORY_CARE_PROVIDER_SITE_OTHER): Payer: Medicare HMO | Admitting: Podiatry

## 2019-10-21 ENCOUNTER — Encounter: Payer: Self-pay | Admitting: Podiatry

## 2019-10-21 ENCOUNTER — Other Ambulatory Visit: Payer: Self-pay

## 2019-10-21 DIAGNOSIS — M2041 Other hammer toe(s) (acquired), right foot: Secondary | ICD-10-CM

## 2019-10-21 DIAGNOSIS — Z9889 Other specified postprocedural states: Secondary | ICD-10-CM

## 2019-10-21 NOTE — Progress Notes (Signed)
  Subjective:  Patient ID: Alexis Howe, female    DOB: [birth year],  MRN: [id number]  Chief Complaint  Patient presents with  . Post-op Problem    DOS 10/16/2019 SCAR EXCISION 2ND AND HAMMER TOE REPAIR 2ND RT    DOS: 10/16/2019 Procedure: Scar excision V to Y and second hammertoe repair  74 y.o. female returns for post-op check.  Comes in for an urgent visit because of bleeding in the dressing.  She has no pain.  She otherwise feels well.  No falls or injuries.  Review of Systems: Negative except as noted in the HPI. Denies N/V/F/Ch.   Objective:  There were no vitals filed for this visit. There is no height or weight on file to calculate BMI. Constitutional Well developed. Well nourished.  Vascular Foot warm and well perfused. Capillary refill normal to all digits.   Neurologic Normal speech. Oriented to person, place, and time. Epicritic sensation to light touch grossly present bilaterally.  Dermatologic Skin healing well without signs of infection. Skin edges well coapted without signs of infection.  Some drainage and dried blood in the dressing.  There is no evidence of hematoma.  No signs of infection.  Orthopedic: Tenderness to palpation noted about the surgical site.  Kirschner wires in good position   Radiographs: Taken at next visit Assessment:   1. Hammer toe of right foot   2. S/P foot surgery, right    Plan:  Patient was evaluated and treated and all questions answered.  S/p foot surgery left -Progressing as expected post-operatively. -WB Status: WBAT in surgical shoe -Sutures: Healing well we will leave intact until next visit with Dr. Milinda Pointer for removal -Foot redressed with sterile dressings.  No follow-ups on file.

## 2019-10-26 ENCOUNTER — Encounter: Payer: Medicare HMO | Admitting: Podiatry

## 2019-11-04 ENCOUNTER — Other Ambulatory Visit: Payer: Self-pay

## 2019-11-04 ENCOUNTER — Ambulatory Visit (INDEPENDENT_AMBULATORY_CARE_PROVIDER_SITE_OTHER): Payer: Medicare HMO | Admitting: Podiatry

## 2019-11-04 DIAGNOSIS — M2041 Other hammer toe(s) (acquired), right foot: Secondary | ICD-10-CM

## 2019-11-04 DIAGNOSIS — Z9889 Other specified postprocedural states: Secondary | ICD-10-CM

## 2019-11-04 NOTE — Progress Notes (Signed)
She presents today date of surgery is 10/16/2019 with release of the second metatarsophalangeal joint and pinning of the toe with a V to Y skin plasty.  He states that is been draining a lot but is not painful.  She denies fever chills nausea vomit muscle aches pains calf pain back pain chest pain shortness of breath.  Objective: Vital signs are stable dry sterile dressing intact was removed demonstrates serosanguineous drainage to the dorsal aspect of the the apex of the skin plasty has died but it is scabbed over so it is draining.  The K wires in place holding the pin in good position.  Assessment: Well-healing surgical toe and foot.  More than likely going to have a small wound to heal.  Plan: At this point I am removed all of the sutures today margins remain well coapted I see no signs of infection so I redressed it with a dry sterile compressive dressing and I will follow-up with her in 1 week and were going to watch the wound carefully.

## 2019-11-11 ENCOUNTER — Encounter: Payer: Self-pay | Admitting: Podiatry

## 2019-11-11 ENCOUNTER — Ambulatory Visit (INDEPENDENT_AMBULATORY_CARE_PROVIDER_SITE_OTHER): Payer: Medicare HMO | Admitting: Podiatry

## 2019-11-11 ENCOUNTER — Ambulatory Visit (INDEPENDENT_AMBULATORY_CARE_PROVIDER_SITE_OTHER): Payer: Medicare HMO

## 2019-11-11 ENCOUNTER — Other Ambulatory Visit: Payer: Self-pay

## 2019-11-11 DIAGNOSIS — M2041 Other hammer toe(s) (acquired), right foot: Secondary | ICD-10-CM | POA: Diagnosis not present

## 2019-11-11 DIAGNOSIS — Z9889 Other specified postprocedural states: Secondary | ICD-10-CM

## 2019-11-11 DIAGNOSIS — L910 Hypertrophic scar: Secondary | ICD-10-CM

## 2019-11-11 NOTE — Progress Notes (Signed)
She presents today for follow-up of her release of the 2nd metatarsal phalangeal joint and skin plasty.  She states that the K wire has done well with the way we taped it in last time no problems.  She denies fever chills nausea vomiting muscle aches and pains.  Objective: Vital signs are stable alert and oriented x3 there is no erythema edema cellulitis drainage or odor the apex of the skin plasty is healing in.  The wound looks much better than it did last week.  I think we will be able to take the wire out in 2 weeks.  Assessment: Well-healing surgical foot.  Plan: Redressed today dressed a compressive dressing just leave this dressing on and will follow up with me me in a couple of weeks for removal of the K wire.

## 2019-11-18 ENCOUNTER — Encounter: Payer: Medicare HMO | Admitting: Podiatry

## 2019-11-25 ENCOUNTER — Other Ambulatory Visit: Payer: Self-pay

## 2019-11-25 ENCOUNTER — Encounter: Payer: Self-pay | Admitting: Podiatry

## 2019-11-25 ENCOUNTER — Ambulatory Visit (INDEPENDENT_AMBULATORY_CARE_PROVIDER_SITE_OTHER): Payer: Medicare HMO | Admitting: Podiatry

## 2019-11-25 ENCOUNTER — Ambulatory Visit (INDEPENDENT_AMBULATORY_CARE_PROVIDER_SITE_OTHER): Payer: Medicare HMO

## 2019-11-25 DIAGNOSIS — Z9889 Other specified postprocedural states: Secondary | ICD-10-CM

## 2019-11-25 DIAGNOSIS — M2041 Other hammer toe(s) (acquired), right foot: Secondary | ICD-10-CM | POA: Diagnosis not present

## 2019-11-25 DIAGNOSIS — L910 Hypertrophic scar: Secondary | ICD-10-CM

## 2019-11-25 MED ORDER — MUPIROCIN 2 % EX OINT: 1.0000 "application " | TOPICAL_OINTMENT | Freq: Every day | CUTANEOUS | 2 refills | Status: AC

## 2019-11-25 NOTE — Progress Notes (Signed)
  Subjective:  Patient ID: Alexis Howe, female    DOB: 14-Jun-1945,  MRN: [id number]  Chief Complaint  Patient presents with  . Routine Post Op    DOS 10/16/2019 SCAR EXCISION 2ND AND HAMMER TOE REPAIR 2ND RT   "Its doing really well"    DOS: 10/16/2019 Procedure: Scar excision V to Y and second hammertoe repair  74 y.o. female returns for post-op check.  She is doing well.  Minimal pain.  Review of Systems: Negative except as noted in the HPI. Denies N/V/F/Ch.   Objective:  There were no vitals filed for this visit. There is no height or weight on file to calculate BMI. Constitutional Well developed. Well nourished.  Vascular Foot warm and well perfused. Capillary refill normal to all digits.   Neurologic Normal speech. Oriented to person, place, and time. Epicritic sensation to light touch grossly present bilaterally.  Dermatologic  skin is largely well-healed there is some delayed healing on the proximal segment of the V to Y skin plasty  Orthopedic: Tenderness to palpation noted about the surgical site.  Kirschner wires in good position   Radiographs: Post pin removal radiographs show good apposition of the arthrodesis surface with good healing and good correction maintained Assessment:   No diagnosis found. Plan:  Patient was evaluated and treated and all questions answered.  S/p foot surgery left -Progressing as expected post-operatively. -WB Status: WBAT in regular shoes as tolerated -K wire was removed uneventfully.  Post removal radiographs were taken.  Coban compression bandage was applied to the toe after removal. -Rx for mupirocin for the area of delayed healing, advised her to apply this daily with a small bandage.  Return in about 1 week (around 12/02/2019) for With Dr. Milinda Pointer.

## 2019-12-02 ENCOUNTER — Ambulatory Visit: Payer: Medicare HMO

## 2019-12-02 ENCOUNTER — Other Ambulatory Visit: Payer: Self-pay

## 2019-12-02 ENCOUNTER — Encounter: Payer: Medicare HMO | Admitting: Podiatry

## 2019-12-02 ENCOUNTER — Ambulatory Visit (INDEPENDENT_AMBULATORY_CARE_PROVIDER_SITE_OTHER): Payer: Medicare HMO | Admitting: Podiatry

## 2019-12-02 DIAGNOSIS — M2041 Other hammer toe(s) (acquired), right foot: Secondary | ICD-10-CM

## 2019-12-02 DIAGNOSIS — L910 Hypertrophic scar: Secondary | ICD-10-CM

## 2019-12-02 DIAGNOSIS — Z9889 Other specified postprocedural states: Secondary | ICD-10-CM

## 2019-12-02 NOTE — Progress Notes (Signed)
She presents today date of surgery 10/16/2019 with excision of painful scar pinning of the toe and release of the second metatarsophalangeal joint right foot.  She states that she is doing well.  Objective: Vital signs are stable alert oriented x3 there is no erythema edema cellulitis drainage or odor toenails are normal she has a small ulcerative lesion at part of the V to Y which appears to be healing in very nicely.  Assessment: Well-healing surgical foot right.  Plan: She is back into her regular shoes now like to follow-up with her in 2 to 3 weeks just to make sure the wound is healed.

## 2019-12-21 ENCOUNTER — Ambulatory Visit (INDEPENDENT_AMBULATORY_CARE_PROVIDER_SITE_OTHER): Payer: Medicare HMO

## 2019-12-21 ENCOUNTER — Encounter: Payer: Self-pay | Admitting: Podiatry

## 2019-12-21 ENCOUNTER — Other Ambulatory Visit: Payer: Self-pay

## 2019-12-21 ENCOUNTER — Ambulatory Visit (INDEPENDENT_AMBULATORY_CARE_PROVIDER_SITE_OTHER): Payer: Medicare HMO | Admitting: Podiatry

## 2019-12-21 DIAGNOSIS — M2041 Other hammer toe(s) (acquired), right foot: Secondary | ICD-10-CM

## 2019-12-21 DIAGNOSIS — L910 Hypertrophic scar: Secondary | ICD-10-CM

## 2019-12-21 DIAGNOSIS — Z9889 Other specified postprocedural states: Secondary | ICD-10-CM

## 2019-12-21 NOTE — Progress Notes (Signed)
She presents today for follow-up of her excision of a painful scar also skin lengthening.  She states is doing pretty well but the problem she is having now is that she has a problem with her hallux rubbing her second toe.  Objective: Vital signs are stable alert oriented x3 there is a small scab that is still present on the dorsum of the foot that has resulted in a very tight adhesion to the dorsum of the foot.  Assessment: Well-healing surgical foot.  Plan: Remove the scab today so hopefully she can start some scar massage therapy and I will follow-up with her in about 6 to 8 weeks.

## 2020-02-01 ENCOUNTER — Encounter: Payer: Medicare HMO | Admitting: Podiatry

## 2020-02-10 ENCOUNTER — Ambulatory Visit (INDEPENDENT_AMBULATORY_CARE_PROVIDER_SITE_OTHER): Payer: Medicare HMO | Admitting: Podiatry

## 2020-02-10 ENCOUNTER — Encounter: Payer: Self-pay | Admitting: Podiatry

## 2020-02-10 ENCOUNTER — Other Ambulatory Visit: Payer: Self-pay

## 2020-02-10 DIAGNOSIS — L910 Hypertrophic scar: Secondary | ICD-10-CM

## 2020-02-10 DIAGNOSIS — M2041 Other hammer toe(s) (acquired), right foot: Secondary | ICD-10-CM | POA: Diagnosis not present

## 2020-02-10 DIAGNOSIS — Z9889 Other specified postprocedural states: Secondary | ICD-10-CM | POA: Diagnosis not present

## 2020-02-10 NOTE — Progress Notes (Signed)
She presents today date of surgery 10/16/2019 for excision of painful scar second and release of the second metatarsophalangeal joint to allow for a previously repaired second toe to sit more rectus.  She states that she is way better than she was she still has a toe that is sitting against the big toe she just does not want to go over the big toe.  She states that the scar is getting better and she has one small area that is still tight.  Objective: Vital signs are stable alert and oriented x3.  Pulses are palpable.  She has mild edema distal to the incision site which is scarred down in one small area.  Otherwise the toe sitting rectus and in good position.  Assessment: Well-healing surgical foot with some edema distal dorsal aspect secondary to scar tissue development.  Plan: At this point I will follow-up with her in a couple of months which will be then 6 months postoperatively.  I have the scar still present we will localize the area make a small stab incision and using a mosquito hemostat release the scar tissue.  She understands and is amenable to it I will follow-up with her in 2 to 3 months

## 2020-03-22 ENCOUNTER — Other Ambulatory Visit: Payer: Self-pay | Admitting: Internal Medicine

## 2020-03-22 DIAGNOSIS — Z1231 Encounter for screening mammogram for malignant neoplasm of breast: Secondary | ICD-10-CM

## 2020-04-11 ENCOUNTER — Other Ambulatory Visit: Payer: Self-pay

## 2020-04-11 ENCOUNTER — Encounter: Payer: Self-pay | Admitting: Podiatry

## 2020-04-11 ENCOUNTER — Ambulatory Visit (INDEPENDENT_AMBULATORY_CARE_PROVIDER_SITE_OTHER): Payer: Medicare HMO | Admitting: Podiatry

## 2020-04-11 DIAGNOSIS — M6240 Contracture of muscle, unspecified site: Secondary | ICD-10-CM | POA: Diagnosis not present

## 2020-04-11 DIAGNOSIS — L910 Hypertrophic scar: Secondary | ICD-10-CM | POA: Diagnosis not present

## 2020-04-11 NOTE — Progress Notes (Signed)
She presents today for follow-up of her contraction at the metatarsophalangeal joint.  Her second toe is still not sitting down though it is doing much better than it was previously.  Objective: Vital signs are stable she is alert and oriented x3.  Pulses are palpable.  At this point she still has scar tissue that is also helping control the contracture of the dorsal aspect of that toe at the metatarsophalangeal joint.  Assessment: Contracted second metatarsophalangeal joint scar tissue.  Plan: At this point we performed a little procedure in the office after local anesthetic was administered around the second metatarsophalangeal joint.  I made a small stab incision after sterile Betadine skin prep with #11 blade and inserted a curved hemostat to help break up the scar tissue from the toe and from the metatarsophalangeal joint area.  She tolerated procedure well it was it was flushed with sterile water and then prepped once again with Betadine I was able to place Dermabond for closure and then dressed a compressive dressing which she will leave on for 4 to 5 days.  She will keep it clean and dry do not wash it I will follow-up with her in 1 week.

## 2020-04-20 ENCOUNTER — Other Ambulatory Visit: Payer: Self-pay

## 2020-04-20 ENCOUNTER — Ambulatory Visit (INDEPENDENT_AMBULATORY_CARE_PROVIDER_SITE_OTHER): Payer: Medicare HMO | Admitting: Podiatry

## 2020-04-20 ENCOUNTER — Encounter: Payer: Self-pay | Admitting: Podiatry

## 2020-04-20 DIAGNOSIS — Z9889 Other specified postprocedural states: Secondary | ICD-10-CM

## 2020-04-20 DIAGNOSIS — M6240 Contracture of muscle, unspecified site: Secondary | ICD-10-CM

## 2020-04-20 NOTE — Progress Notes (Signed)
She presents today status post tenotomy second tag release of the scar tissue states that he feels fine but is still not laying down completely flat yet but is doing better than it was.  Objective: Vital signs are stable alert and oriented x3.  Pulses are palpable.  There is no erythema edema cellulitis drainage or odor there is some scar tissue just from the recent scar.  The toe is just dorsiflex slightly.  Assessment: Well-healing surgical toe.  Plan: I will put her in a daily toe hammertoe corrector and she will continue the Darco splint at night.  Follow-up with her as needed

## 2020-07-19 ENCOUNTER — Other Ambulatory Visit: Payer: Self-pay

## 2020-07-19 ENCOUNTER — Ambulatory Visit
Admission: RE | Admit: 2020-07-19 | Discharge: 2020-07-19 | Disposition: A | Discharge status: Home / Self Care | Payer: Medicare HMO | Source: Ambulatory Visit | Attending: Internal Medicine | Admitting: Internal Medicine

## 2020-07-19 DIAGNOSIS — Z1231 Encounter for screening mammogram for malignant neoplasm of breast: Secondary | ICD-10-CM | POA: Diagnosis not present

## 2021-03-30 ENCOUNTER — Other Ambulatory Visit: Payer: Self-pay | Admitting: Internal Medicine

## 2021-03-30 DIAGNOSIS — Z1231 Encounter for screening mammogram for malignant neoplasm of breast: Secondary | ICD-10-CM

## 2021-07-20 ENCOUNTER — Ambulatory Visit
Admission: RE | Admit: 2021-07-20 | Discharge: 2021-07-20 | Disposition: A | Discharge status: Home / Self Care | Payer: Medicare HMO | Source: Ambulatory Visit | Attending: Internal Medicine | Admitting: Internal Medicine

## 2021-07-20 DIAGNOSIS — Z1231 Encounter for screening mammogram for malignant neoplasm of breast: Secondary | ICD-10-CM | POA: Insufficient documentation

## 2022-04-23 ENCOUNTER — Other Ambulatory Visit: Payer: Self-pay | Admitting: Internal Medicine

## 2022-04-23 DIAGNOSIS — Z1231 Encounter for screening mammogram for malignant neoplasm of breast: Secondary | ICD-10-CM

## 2022-07-23 ENCOUNTER — Ambulatory Visit
Admission: RE | Admit: 2022-07-23 | Discharge: 2022-07-23 | Disposition: A | Discharge status: Home / Self Care | Payer: Medicare HMO | Source: Ambulatory Visit | Attending: Internal Medicine | Admitting: Internal Medicine

## 2022-07-23 DIAGNOSIS — Z1231 Encounter for screening mammogram for malignant neoplasm of breast: Secondary | ICD-10-CM | POA: Diagnosis not present

## 2022-12-04 ENCOUNTER — Encounter: Payer: Self-pay | Admitting: *Deleted

## 2023-02-21 IMAGING — MG DIGITAL SCREENING UNILAT RIGHT IMPLANT  W/ TOMO W/ CAD
8 series · 9 of 16 positions shown · non-contrast
Comparison: Previous exam(s).

CLINICAL DATA: Screening.

EXAM:
DIGITAL SCREENING UNILATERAL RIGHT MAMMOGRAM WITH IMPLANTS, CAD AND
TOMOSYNTHESIS
TECHNIQUE: Right screening digital craniocaudal and mediolateral oblique
mammograms were obtained. Right screening digital breast
tomosynthesis was performed. The images were evaluated with
computer-aided detection. Standard and/or implant displaced views
were performed.

[R CC (1 of 2)]
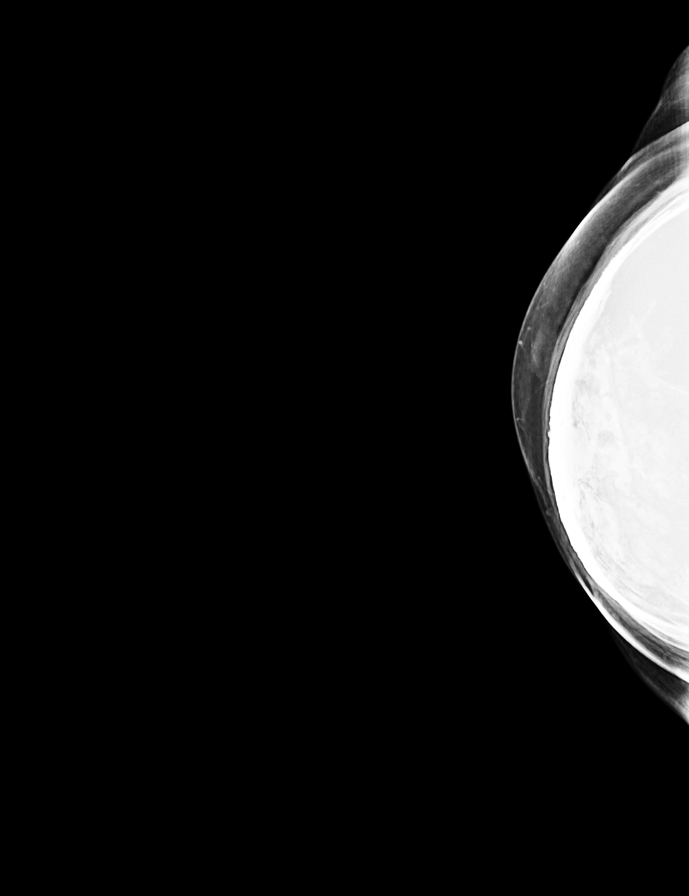

[R MLO (1 of 2)]
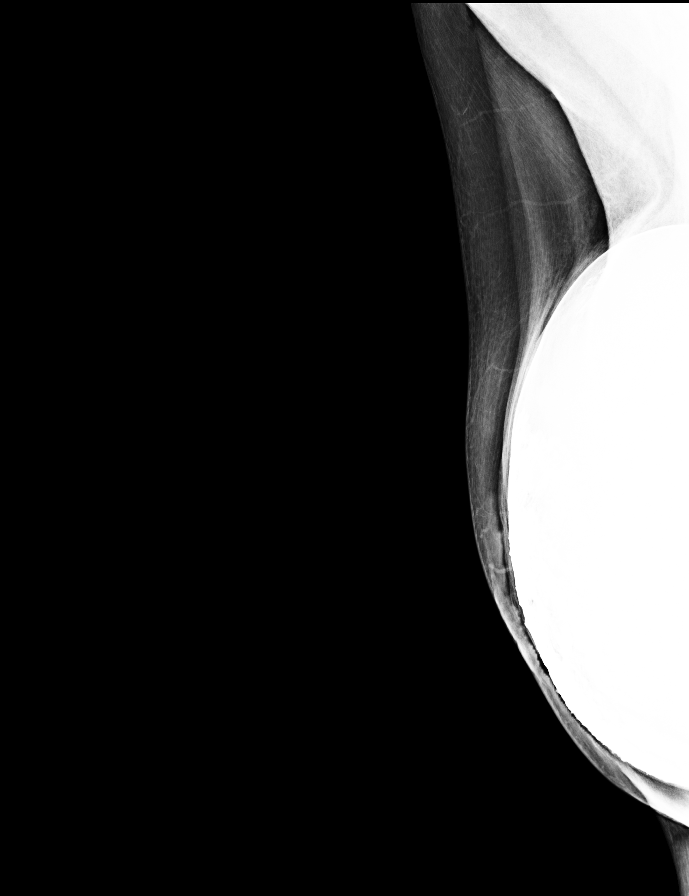

[R CC (2 of 2)]
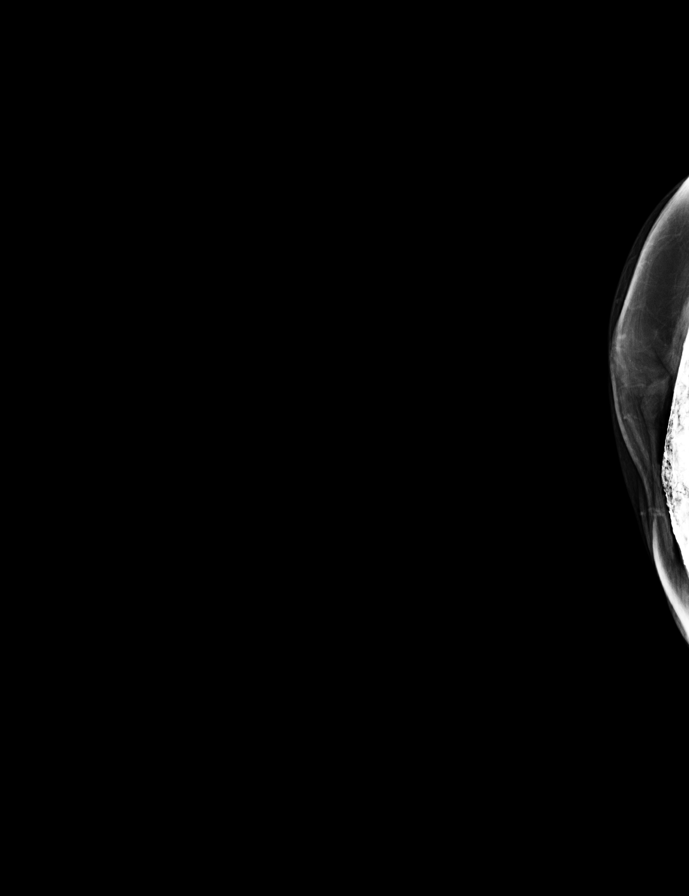

[R CC synth-2D]
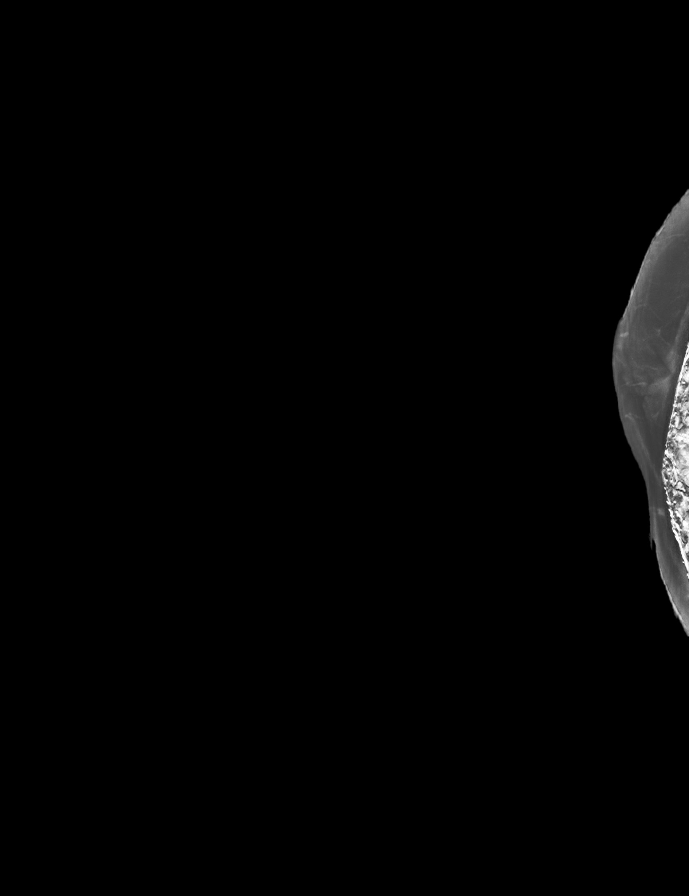

[R MLO (2 of 2)]
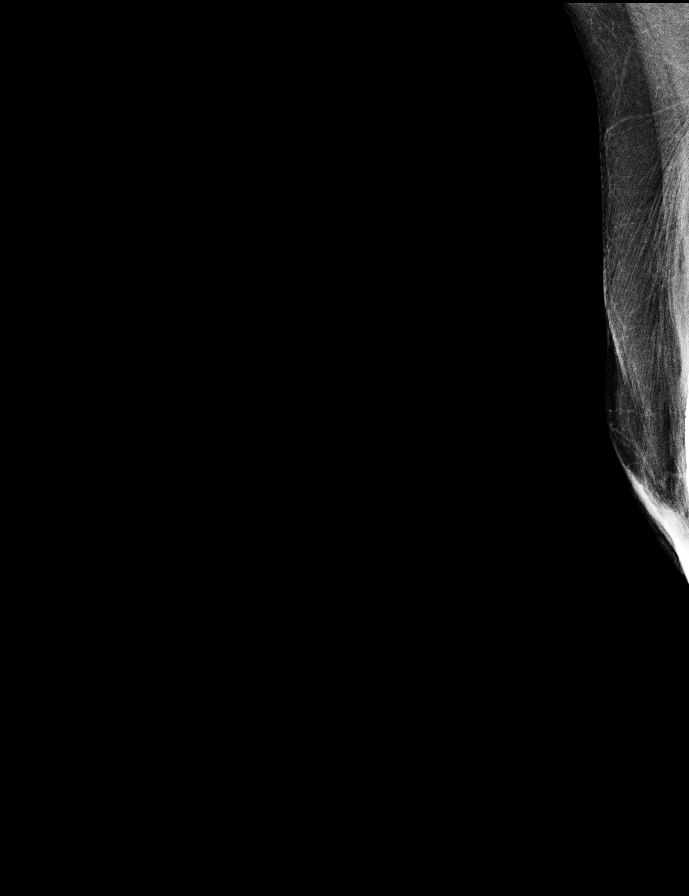

[R MLO synth-2D]
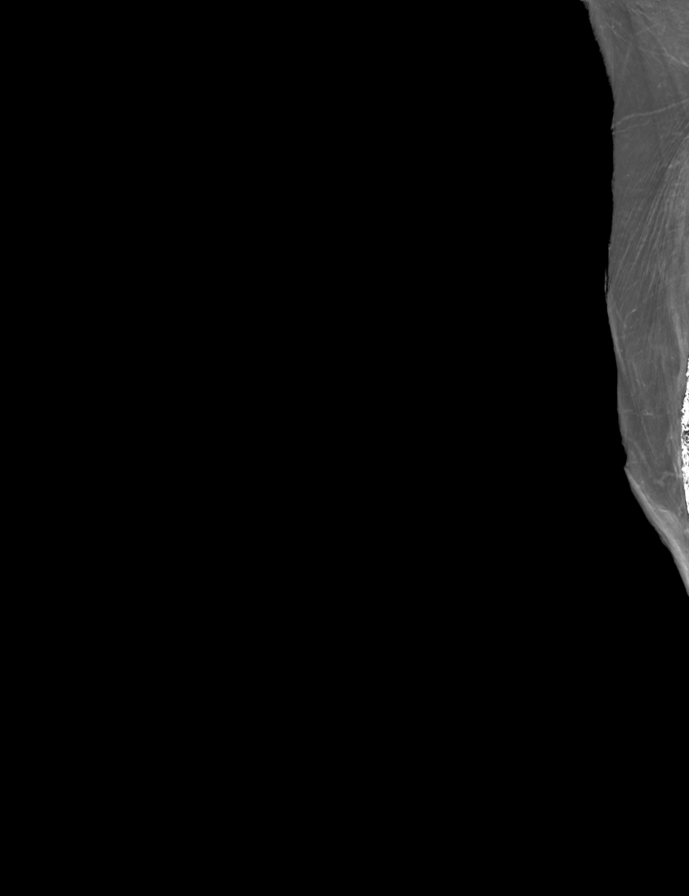

[R CCID BREAST TOMOSYNTHESIS IMAGE tomo · 2 of 54 frames shown]
[frame 18/54]
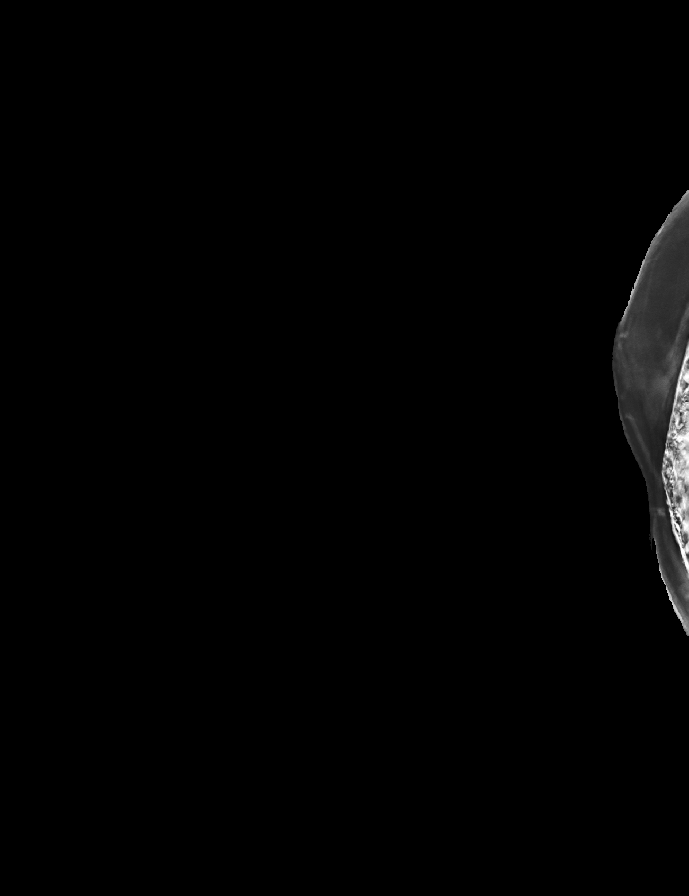
[frame 27/54]
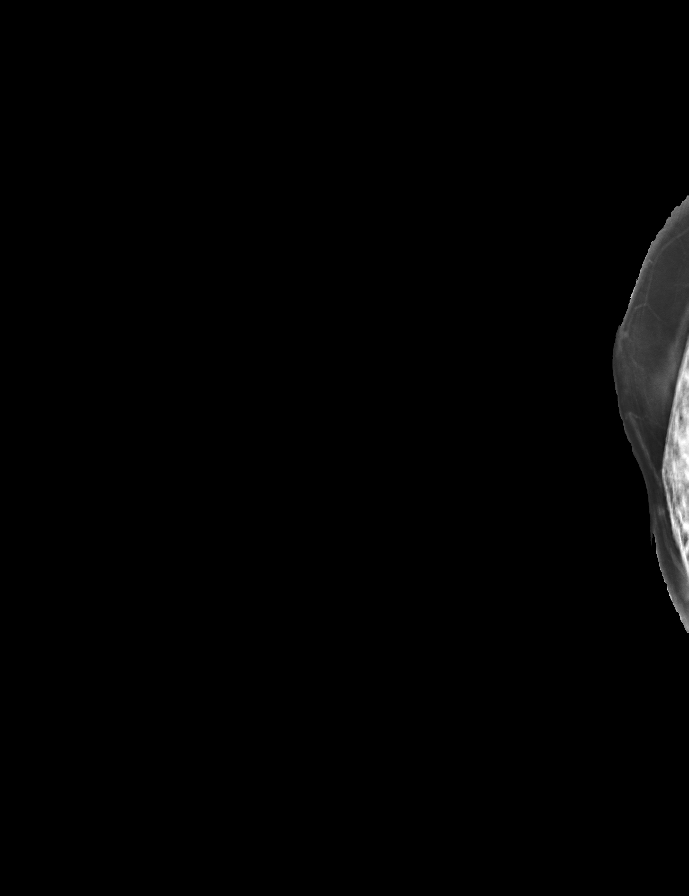

[R MLOID BREAST TOMOSYNTHESIS IMAGE tomo · tomo slice 27/52.0]
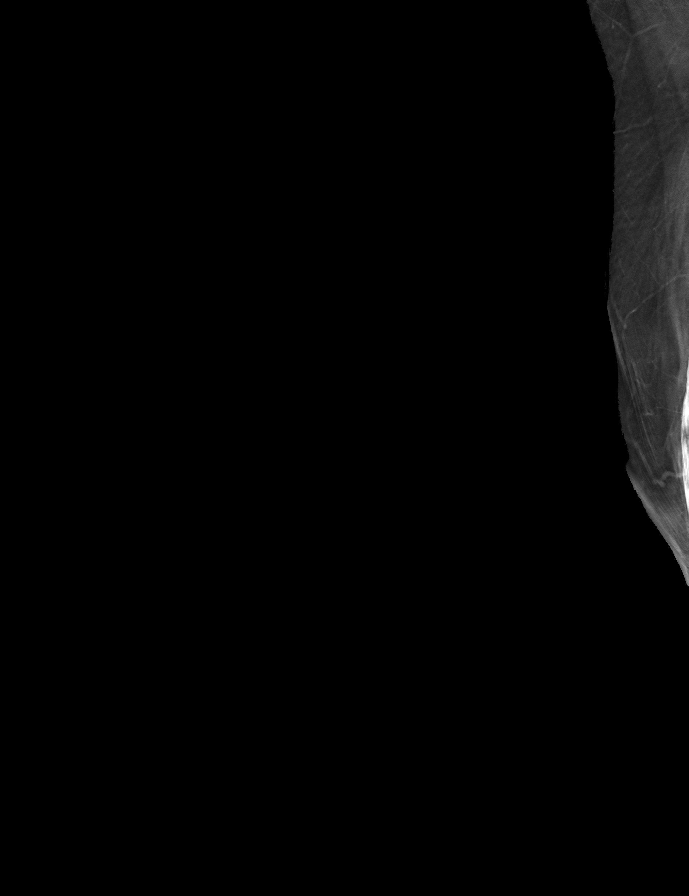

[9 of 16 positions shown; findings below may reference images not displayed]

ACR Breast Density Category b: There are scattered areas of
fibroglandular density.
FINDINGS: The patient has retropectoral implants. There are no findings
suspicious for malignancy.
IMPRESSION: No mammographic evidence of malignancy. A result letter of this
screening mammogram will be mailed directly to the patient.

RECOMMENDATION:
Screening mammogram in one year. (Code:41-S-YXA)

BI-RADS CATEGORY  1:  Negative.

## 2023-03-12 ENCOUNTER — Encounter: Payer: Self-pay | Admitting: *Deleted

## 2023-03-25 ENCOUNTER — Ambulatory Visit
Admission: RE | Admit: 2023-03-25 | Discharge: 2023-03-25 | Disposition: A | Discharge status: Home / Self Care | Payer: Medicare HMO | Attending: Gastroenterology | Admitting: Gastroenterology

## 2023-03-25 ENCOUNTER — Ambulatory Visit: Admitting: Anesthesiology

## 2023-03-25 ENCOUNTER — Encounter: Payer: Self-pay | Admitting: *Deleted

## 2023-03-25 ENCOUNTER — Encounter
Admission: RE | Disposition: A | Discharge status: Home / Self Care | Payer: Self-pay | Source: Home / Self Care | Attending: Gastroenterology

## 2023-03-25 DIAGNOSIS — E538 Deficiency of other specified B group vitamins: Secondary | ICD-10-CM | POA: Insufficient documentation

## 2023-03-25 DIAGNOSIS — D122 Benign neoplasm of ascending colon: Secondary | ICD-10-CM | POA: Insufficient documentation

## 2023-03-25 DIAGNOSIS — Z1211 Encounter for screening for malignant neoplasm of colon: Secondary | ICD-10-CM | POA: Diagnosis present

## 2023-03-25 DIAGNOSIS — K5730 Diverticulosis of large intestine without perforation or abscess without bleeding: Secondary | ICD-10-CM | POA: Insufficient documentation

## 2023-03-25 DIAGNOSIS — Z9049 Acquired absence of other specified parts of digestive tract: Secondary | ICD-10-CM | POA: Insufficient documentation

## 2023-03-25 DIAGNOSIS — E785 Hyperlipidemia, unspecified: Secondary | ICD-10-CM | POA: Diagnosis not present

## 2023-03-25 HISTORY — DX: Vitamin D deficiency, unspecified: E55.9

## 2023-03-25 HISTORY — PX: POLYPECTOMY: SHX149

## 2023-03-25 HISTORY — PX: SUBMUCOSAL INJECTION: SHX5543

## 2023-03-25 HISTORY — DX: Age-related osteoporosis without current pathological fracture: M81.0

## 2023-03-25 HISTORY — DX: Hyperlipidemia, unspecified: E78.5

## 2023-03-25 HISTORY — DX: Hypotension, unspecified: I95.9

## 2023-03-25 HISTORY — DX: Other allergy status, other than to drugs and biological substances: Z91.09

## 2023-03-25 HISTORY — PX: HEMOSTASIS CLIP PLACEMENT: SHX6857

## 2023-03-25 HISTORY — DX: Deficiency of other specified B group vitamins: E53.8

## 2023-03-25 HISTORY — PX: COLONOSCOPY WITH PROPOFOL: SHX5780

## 2023-03-25 SURGERY — COLONOSCOPY WITH PROPOFOL
Anesthesia: General

## 2023-03-25 MED ORDER — PROPOFOL 1000 MG/100ML IV EMUL
INTRAVENOUS | Status: AC
  Filled 2023-03-25: qty 100

## 2023-03-25 MED ORDER — SODIUM CHLORIDE 0.9 % IV SOLN
INTRAVENOUS | Status: DC
  Administered 2023-03-25: 1000 mL via INTRAVENOUS

## 2023-03-25 MED ORDER — SODIUM CHLORIDE (PF) 0.9 % IJ SOLN: INTRAVENOUS | Status: DC | PRN

## 2023-03-25 MED ORDER — SPOT INK MARKER SYRINGE KIT
PACK | SUBMUCOSAL | Status: DC | PRN
  Administered 2023-03-25: 1 mL via SUBMUCOSAL

## 2023-03-25 MED ORDER — EPHEDRINE SULFATE-NACL 50-0.9 MG/10ML-% IV SOSY
PREFILLED_SYRINGE | INTRAVENOUS | Status: DC | PRN
  Administered 2023-03-25 (×2): 10 mg via INTRAVENOUS

## 2023-03-25 MED ORDER — PROPOFOL 500 MG/50ML IV EMUL
INTRAVENOUS | Status: DC | PRN
  Administered 2023-03-25: 120 ug/kg/min via INTRAVENOUS

## 2023-03-25 MED ORDER — EPHEDRINE 5 MG/ML INJ
INTRAVENOUS | Status: AC
  Filled 2023-03-25: qty 5

## 2023-03-25 MED ORDER — PROPOFOL 10 MG/ML IV BOLUS
INTRAVENOUS | Status: DC | PRN
  Administered 2023-03-25: 50 mg via INTRAVENOUS

## 2023-03-25 MED ORDER — METHYLENE BLUE (ANTIDOTE) 1 % IV SOLN
INTRAVENOUS | Status: DC | PRN
  Administered 2023-03-25: 5 mL via SUBMUCOSAL

## 2023-03-25 NOTE — Interval H&P Note (Signed)
 History and Physical Interval Note:  03/25/2023 9:12 AM  Alexis Howe  has presented today for surgery, with the diagnosis of CCA SCREEN.  The various methods of treatment have been discussed with the patient and family. After consideration of risks, benefits and other options for treatment, the patient has consented to  Procedure(s): COLONOSCOPY WITH PROPOFOL (N/A) as a surgical intervention.  The patient's history has been reviewed, patient examined, no change in status, stable for surgery.  I have reviewed the patient's chart and labs.  Questions were answered to the patient's satisfaction.     Regis Bill  Ok to proceed with colonoscopy

## 2023-03-25 NOTE — Transfer of Care (Signed)
 Immediate Anesthesia Transfer of Care Note  Patient: Alexis Howe  Procedure(s) Performed: COLONOSCOPY WITH PROPOFOL POLYPECTOMY, INTESTINE INJECTION, SUBMUCOSAL CONTROL OF HEMORRHAGE, GI TRACT, ENDOSCOPIC, BY CLIPPING OR OVERSEWING  Patient Location: PACU  Anesthesia Type:General  Level of Consciousness: awake and sedated  Airway & Oxygen Therapy: Patient Spontanous Breathing and Patient connected to nasal cannula oxygen  Post-op Assessment: Report given to RN and Post -op Vital signs reviewed and stable  Post vital signs: Reviewed and stable  Last Vitals:  Vitals Value Taken Time  BP    Temp    Pulse    Resp    SpO2      Last Pain:  Vitals:   03/25/23 0829  TempSrc: Temporal  PainSc: 0-No pain         Complications: There were no known notable events for this encounter.

## 2023-03-25 NOTE — Anesthesia Preprocedure Evaluation (Addendum)
 Anesthesia Evaluation  Patient identified by MRN, date of birth, ID band Patient awake    Reviewed: Allergy & Precautions, H&P , NPO status , Patient's Chart, lab work & pertinent test results  Airway Mallampati: II  TM Distance: >3 FB Neck ROM: full    Dental no notable dental hx.    Pulmonary neg pulmonary ROS   Pulmonary exam normal        Cardiovascular hypertension, Normal cardiovascular exam     Neuro/Psych negative neurological ROS  negative psych ROS   GI/Hepatic negative GI ROS, Neg liver ROS,,,  Endo/Other  negative endocrine ROS    Renal/GU negative Renal ROS  negative genitourinary   Musculoskeletal   Abdominal   Peds  Hematology negative hematology ROS (+)   Anesthesia Other Findings Past Medical History: No date: B12 deficiency 1985: Cancer (HCC)     Comment:  left breast mastectomy with reconstruction No date: Environmental allergies No date: Hyperlipidemia No date: Hypotension 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 right               subcutaneous mastectomy for preventative. (orginal               nipple) 1985: MASTECTOMY; Left     Comment:  mastectomy with reconstructive surgery No date: TUBAL LIGATION     Reproductive/Obstetrics negative OB ROS                             Anesthesia Physical Anesthesia Plan  ASA: 2  Anesthesia Plan: General   Post-op Pain Management:    Induction: Intravenous  PONV Risk Score and Plan: Propofol infusion and TIVA  Airway Management Planned: Natural Airway  Additional Equipment:   Intra-op Plan:   Post-operative Plan:   Informed Consent: I have reviewed the patients History and Physical, chart, labs and discussed the procedure including the  risks, benefits and alternatives for the proposed anesthesia with the patient or authorized representative who has indicated his/her understanding and acceptance.     Dental Advisory Given  Plan Discussed with: CRNA and Surgeon  Anesthesia Plan Comments:         Anesthesia Quick Evaluation

## 2023-03-25 NOTE — Op Note (Signed)
 [facility] Patient Name: Alexis Howe Procedure Date: 03/25/2023 9:01 AM MRN: [id number] Account #: [id number] Date of Birth: [birth year] Admit Type: Outpatient Age: 78 Room: [facility] ENDO ROOM 3 Gender: Female Note Status: Finalized Instrument Name: Colonscope [id number],Peds Colonoscope [id number] Procedure:             Colonoscopy Indications:           Screening for colorectal malignant neoplasm Providers:             Eather Colas MD, MD Referring MD:          Duane Lope. Judithann Sheen, MD (Referring MD) Medicines:             Monitored Anesthesia Care Complications:         No immediate complications. Estimated blood loss:                         Minimal. Procedure:             Pre-Anesthesia Assessment:                        - Prior to the procedure, a History and Physical was                         performed, and patient medications and allergies were                         reviewed. The patient is competent. The risks and                         benefits of the procedure and the sedation options and                         risks were discussed with the patient. All questions                         were answered and informed consent was obtained.                         Patient identification and proposed procedure were                         verified by the physician, the nurse, the                         anesthesiologist, the anesthetist and the technician                         in the endoscopy suite. Mental Status Examination:                         alert and oriented. Airway Examination: normal                         oropharyngeal airway and neck mobility. Respiratory                         Examination: clear to auscultation. CV Examination:  normal. Prophylactic Antibiotics: The patient does not                         require prophylactic antibiotics. Prior                         Anticoagulants: The patient has taken  no anticoagulant                         or antiplatelet agents. ASA Grade Assessment: II - A                         patient with mild systemic disease. After reviewing                         the risks and benefits, the patient was deemed in                         satisfactory condition to undergo the procedure. The                         anesthesia plan was to use monitored anesthesia care                         (MAC). Immediately prior to administration of                         medications, the patient was re-assessed for adequacy                         to receive sedatives. The heart rate, respiratory                         rate, oxygen saturations, blood pressure, adequacy of                         pulmonary ventilation, and response to care were                         monitored throughout the procedure. The physical                         status of the patient was re-assessed after the                         procedure.                        After obtaining informed consent, the colonoscope was                         passed under direct vision. Throughout the procedure,                         the patient's blood pressure, pulse, and oxygen                         saturations were monitored continuously. The  Colonoscope was introduced through the anus and                         advanced to the the cecum, identified by appendiceal                         orifice and ileocecal valve. The Colonoscope was                         introduced through the anus and advanced to the the                         cecum, identified by appendiceal orifice and ileocecal                         valve. The colonoscopy was performed without                         difficulty. The patient tolerated the procedure well.                         The quality of the bowel preparation was good. The                         ileocecal valve, appendiceal orifice, and rectum were                          photographed. Findings:      The perianal and digital rectal examinations were normal.      A 20 mm polyp was found in the ascending colon. The polyp was granular       lateral spreading. Preparations were made for mucosal resection.       Demarcation of the lesion was performed with narrow band imaging to       clearly identify the boundaries of the lesion. Eleview was injected to       raise the lesion. Snare mucosal resection was performed. Resection and       retrieval were complete. Resected tissue margins were examined and clear       of polyp tissue. To prevent bleeding after the polypectomy, four       hemostatic clips were successfully placed. There was no bleeding at the       end of the procedure. Estimated blood loss was minimal. Area was       tattooed with an injection of 0.5 mL of [place] ink.      A few small-mouthed diverticula were found in the sigmoid colon.      The exam was otherwise without abnormality on direct and retroflexion       views. Impression:            - One 20 mm polyp in the ascending colon, removed with                         mucosal resection. Resected and retrieved. Clips were                         placed. Tattooed.                        -  Diverticulosis in the sigmoid colon.                        - The examination was otherwise normal on direct and                         retroflexion views.                        - Mucosal resection was performed. Resection and                         retrieval were complete. Recommendation:        - Discharge patient to home.                        - Resume previous diet.                        - Continue present medications.                        - Await pathology results.                        - Repeat colonoscopy in 6 months to review the                         polypectomy site.                        - Return to referring physician as previously                          scheduled.                        - I felt there was adequate closure of polypectomy                         site with the clips. If any symptoms of                         fevers/abdominal pain then would consider                         post-polypectomy syndrome given size and location of                         polyp. Procedure Code(s):     --- Professional ---                        [id number], Colonoscopy, flexible; with endoscopic mucosal                         resection Diagnosis Code(s):     --- Professional ---                        Z12.11, Encounter for screening for malignant neoplasm  of colon                        D12.2, Benign neoplasm of ascending colon                        K57.30, Diverticulosis of large intestine without                         perforation or abscess without bleeding CPT copyright 2022 American Medical Association. All rights reserved. The codes documented in this report are preliminary and upon coder review may  be revised to meet current compliance requirements. Eather Colas MD, MD 03/25/2023 10:05:54 AM Number of Addenda: 0 Note Initiated On: 03/25/2023 9:01 AM Scope Withdrawal Time: 0 hours 26 minutes 53 seconds  Total Procedure Duration: 0 hours 32 minutes 9 seconds  Estimated Blood Loss:  Estimated blood loss was minimal.      [facility]

## 2023-03-25 NOTE — H&P (Signed)
 Outpatient short stay form Pre-procedure 03/25/2023  Regis Bill, MD  Primary Physician: Marguarite Arbour, MD  Reason for visit:  Screening  History of present illness:    78 y/o lady with history of hyperlipidemia and b12 deficiency here for screening colonoscopy. Had normal colonoscopy 10 years ago. No blood thinners. No family history of GI malignancies. History of appendectomy.    Current Facility-Administered Medications:    0.9 %  sodium chloride infusion, , Intravenous, Continuous, Chez Bulnes, Rossie Muskrat, MD, Last Rate: 20 mL/hr at 03/25/23 0846, 1,000 mL at 03/25/23 0846  Medications Prior to Admission  Medication Sig Dispense Refill Last Dose/Taking   aspirin EC 81 MG tablet Take 81 mg by mouth daily.      lovastatin (MEVACOR) 40 MG tablet Take 40 mg by mouth daily.      mupirocin ointment (BACTROBAN) 2 % Apply 1 application topically daily. 15 g 2    raloxifene (EVISTA) 60 MG tablet Take 60 mg by mouth daily.      vitamin B-12 (CYANOCOBALAMIN) 1000 MCG tablet Take 1,000 mcg by mouth daily.      Vitamin D, Ergocalciferol, (DRISDOL) 1.25 MG (50000 UT) CAPS capsule Take 50,000 Units by mouth once a week.        No Known Allergies   Past Medical History:  Diagnosis Date   B12 deficiency    Cancer (HCC) 1985   left breast mastectomy with reconstruction   Environmental allergies    Hyperlipidemia    Hypotension    Osteoporosis, post-menopausal    Vitamin D deficiency     Review of systems:  Otherwise negative.    Physical Exam  Gen: Alert, oriented. Appears stated age.  HEENT: PERRLA. Lungs: No respiratory distress CV: RRR Abd: soft, benign, no masses Ext: No edema    Planned procedures: Proceed with colonoscopy. The patient understands the nature of the planned procedure, indications, risks, alternatives and potential complications including but not limited to bleeding, infection, perforation, damage to internal organs and possible oversedation/side  effects from anesthesia. The patient agrees and gives consent to proceed.  Please refer to procedure notes for findings, recommendations and patient disposition/instructions.     Regis Bill, MD [facility]

## 2023-03-26 LAB — SURGICAL PATHOLOGY

## 2023-03-26 NOTE — Anesthesia Postprocedure Evaluation (Signed)
 Anesthesia Post Note  Patient: Alexis Howe  Procedure(s) Performed: COLONOSCOPY WITH PROPOFOL POLYPECTOMY, INTESTINE INJECTION, SUBMUCOSAL CONTROL OF HEMORRHAGE, GI TRACT, ENDOSCOPIC, BY CLIPPING OR OVERSEWING  Patient location during evaluation: Endoscopy Anesthesia Type: General Level of consciousness: awake and alert Pain management: pain level controlled Vital Signs Assessment: post-procedure vital signs reviewed and stable Respiratory status: spontaneous breathing, nonlabored ventilation and respiratory function stable Cardiovascular status: blood pressure returned to baseline and stable Postop Assessment: no apparent nausea or vomiting Anesthetic complications: no   There were no known notable events for this encounter.   Last Vitals:  Vitals:   03/25/23 1005 03/25/23 1015  BP: 91/80 91/67  Pulse: 90 84  Resp: 15 12  Temp:    SpO2: 100% 100%    Last Pain:  Vitals:   03/26/23 0741  TempSrc:   PainSc: 0-No pain                 Foye Deer

## 2023-04-12 ENCOUNTER — Other Ambulatory Visit: Payer: Self-pay | Admitting: Internal Medicine

## 2023-04-12 DIAGNOSIS — Z1231 Encounter for screening mammogram for malignant neoplasm of breast: Secondary | ICD-10-CM

## 2023-07-24 ENCOUNTER — Ambulatory Visit
Admission: RE | Admit: 2023-07-24 | Discharge: 2023-07-24 | Disposition: A | Discharge status: Home / Self Care | Source: Ambulatory Visit | Attending: Internal Medicine | Admitting: Internal Medicine

## 2023-07-24 DIAGNOSIS — Z1231 Encounter for screening mammogram for malignant neoplasm of breast: Secondary | ICD-10-CM | POA: Insufficient documentation

## 2023-08-09 ENCOUNTER — Emergency Department
Admission: EM | Admit: 2023-08-09 | Discharge: 2023-08-09 | Disposition: A | Discharge status: Home / Self Care | Attending: Emergency Medicine | Admitting: Emergency Medicine

## 2023-08-09 ENCOUNTER — Emergency Department

## 2023-08-09 ENCOUNTER — Other Ambulatory Visit: Payer: Self-pay

## 2023-08-09 DIAGNOSIS — R079 Chest pain, unspecified: Secondary | ICD-10-CM | POA: Diagnosis not present

## 2023-08-09 DIAGNOSIS — Y92410 Unspecified street and highway as the place of occurrence of the external cause: Secondary | ICD-10-CM | POA: Diagnosis not present

## 2023-08-09 DIAGNOSIS — N179 Acute kidney failure, unspecified: Secondary | ICD-10-CM | POA: Insufficient documentation

## 2023-08-09 DIAGNOSIS — S1091XA Abrasion of unspecified part of neck, initial encounter: Secondary | ICD-10-CM | POA: Insufficient documentation

## 2023-08-09 DIAGNOSIS — Z853 Personal history of malignant neoplasm of breast: Secondary | ICD-10-CM | POA: Diagnosis not present

## 2023-08-09 LAB — BASIC METABOLIC PANEL WITH GFR
Anion gap: 9 (ref 5–15)
BUN: 24 mg/dL — ABNORMAL HIGH (ref 8–23)
CO2: 26 mmol/L (ref 22–32)
Calcium: 9.1 mg/dL (ref 8.9–10.3)
Chloride: 104 mmol/L (ref 98–111)
Creatinine, Ser: 1.27 mg/dL — ABNORMAL HIGH (ref 0.44–1.00)
GFR, Estimated: 43 mL/min — ABNORMAL LOW (ref >60)
Glucose, Bld: 190 mg/dL — ABNORMAL HIGH (ref 70–99)
Potassium: 3.9 mmol/L (ref 3.5–5.1)
Sodium: 139 mmol/L (ref 135–145)

## 2023-08-09 LAB — CBC
HCT: 37.9 % (ref 36.0–46.0)
Hemoglobin: 12.5 g/dL (ref 12.0–15.0)
MCH: 29.8 pg (ref 26.0–34.0)
MCHC: 33 g/dL (ref 30.0–36.0)
MCV: 90.5 fL (ref 80.0–100.0)
Platelets: 210 K/uL (ref 150–400)
RBC: 4.19 MIL/uL (ref 3.87–5.11)
RDW: 13.3 % (ref 11.5–15.5)
WBC: 8.7 K/uL (ref 4.0–10.5)
nRBC: 0 % (ref 0.0–0.2)

## 2023-08-09 LAB — TROPONIN I (HIGH SENSITIVITY): Troponin I (High Sensitivity): 4 ng/L (ref <18)
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
## 2023-09-23 ENCOUNTER — Ambulatory Visit: Admitting: Certified Registered"

## 2023-09-23 ENCOUNTER — Encounter
Admission: RE | Disposition: A | Discharge status: Home / Self Care | Payer: Self-pay | Source: Home / Self Care | Attending: Gastroenterology

## 2023-09-23 ENCOUNTER — Ambulatory Visit
Admission: RE | Admit: 2023-09-23 | Discharge: 2023-09-23 | Disposition: A | Discharge status: Home / Self Care | Attending: Gastroenterology | Admitting: Gastroenterology

## 2023-09-23 DIAGNOSIS — Z09 Encounter for follow-up examination after completed treatment for conditions other than malignant neoplasm: Secondary | ICD-10-CM | POA: Insufficient documentation

## 2023-09-23 DIAGNOSIS — D122 Benign neoplasm of ascending colon: Secondary | ICD-10-CM | POA: Insufficient documentation

## 2023-09-23 DIAGNOSIS — Z9049 Acquired absence of other specified parts of digestive tract: Secondary | ICD-10-CM | POA: Insufficient documentation

## 2023-09-23 DIAGNOSIS — E538 Deficiency of other specified B group vitamins: Secondary | ICD-10-CM | POA: Insufficient documentation

## 2023-09-23 DIAGNOSIS — E785 Hyperlipidemia, unspecified: Secondary | ICD-10-CM | POA: Diagnosis not present

## 2023-09-23 HISTORY — PX: POLYPECTOMY: SHX149

## 2023-09-23 HISTORY — PX: COLONOSCOPY: SHX5424

## 2023-09-23 SURGERY — COLONOSCOPY
Anesthesia: General

## 2023-09-23 MED ORDER — PROPOFOL 500 MG/50ML IV EMUL
INTRAVENOUS | Status: DC | PRN
  Administered 2023-09-23: 120 ug/kg/min via INTRAVENOUS

## 2023-09-23 MED ORDER — LIDOCAINE 2% (20 MG/ML) 5 ML SYRINGE
INTRAMUSCULAR | Status: DC | PRN
  Administered 2023-09-23: 20 mg via INTRAVENOUS

## 2023-09-23 MED ORDER — SODIUM CHLORIDE 0.9 % IV SOLN
INTRAVENOUS | Status: DC
  Administered 2023-09-23: 500 mL via INTRAVENOUS

## 2023-09-23 MED ORDER — PROPOFOL 10 MG/ML IV BOLUS
INTRAVENOUS | Status: DC | PRN
  Administered 2023-09-23: 20 mg via INTRAVENOUS
  Administered 2023-09-23: 50 mg via INTRAVENOUS

## 2023-09-23 MED ORDER — STERILE WATER FOR IRRIGATION IR SOLN
Status: DC | PRN
  Administered 2023-09-23: 60 mL

## 2023-09-23 NOTE — Op Note (Signed)
 [facility] Patient Name: Alexis Howe Procedure Date: 09/23/2023 12:21 PM MRN: [id number] Account #: [id number] Date of Birth: Dec 04, 1945 Admit Type: Outpatient Age: 78 Room: [facility] ENDO ROOM 3 Gender: Female Note Status: Finalized Instrument Name: Colon Scope [id number] Procedure:             Colonoscopy Indications:           Surveillance: Personal history of piecemeal removal of                         adenoma on last colonoscopy 6 months ago Providers:             Ole Schick MD, MD Medicines:             Monitored Anesthesia Care Complications:         No immediate complications. Estimated blood loss:                         Minimal. Procedure:             Pre-Anesthesia Assessment:                        - Prior to the procedure, a History and Physical was                         performed, and patient medications and allergies were                         reviewed. The patient is competent. The risks and                         benefits of the procedure and the sedation options and                         risks were discussed with the patient. All questions                         were answered and informed consent was obtained.                         Patient identification and proposed procedure were                         verified by the physician, the nurse, the                         anesthesiologist, the anesthetist and the technician                         in the endoscopy suite. Mental Status Examination:                         alert and oriented. Airway Examination: normal                         oropharyngeal airway and neck mobility. Respiratory                         Examination: clear to auscultation. CV Examination:  normal. Prophylactic Antibiotics: The patient does not                         require prophylactic antibiotics. Prior                         Anticoagulants: The patient has taken no  anticoagulant                         or antiplatelet agents. ASA Grade Assessment: II - A                         patient with mild systemic disease. After reviewing                         the risks and benefits, the patient was deemed in                         satisfactory condition to undergo the procedure. The                         anesthesia plan was to use monitored anesthesia care                         (MAC). Immediately prior to administration of                         medications, the patient was re-assessed for adequacy                         to receive sedatives. The heart rate, respiratory                         rate, oxygen saturations, blood pressure, adequacy of                         pulmonary ventilation, and response to care were                         monitored throughout the procedure. The physical                         status of the patient was re-assessed after the                         procedure.                        After obtaining informed consent, the colonoscope was                         passed under direct vision. Throughout the procedure,                         the patient's blood pressure, pulse, and oxygen                         saturations were monitored continuously. The  Colonoscope was introduced through the anus and                         advanced to the the cecum, identified by appendiceal                         orifice and ileocecal valve. The colonoscopy was                         performed without difficulty. The patient tolerated                         the procedure well. The quality of the bowel                         preparation was adequate to identify polyps. The                         ileocecal valve, appendiceal orifice, and rectum were                         photographed. Findings:      The perianal and digital rectal examinations were normal.      A 7 mm polyp was found in the ascending  colon. The polyp was sessile.       The polyp was removed with a cold snare. Resection and retrieval were       complete. Estimated blood loss was minimal.      A post polypectomy scar was found in the ascending colon. There was       residual polypoid tissue. Suspect this is regenerative tissue due to the       clips placed at last colonoscopy. The polyp was removed with a jumbo       cold forceps. Resection and retrieval were complete. Estimated blood       loss was minimal.      The exam was otherwise without abnormality on direct and retroflexion       views. Impression:            - One 7 mm polyp in the ascending colon, removed with                         a cold snare. Resected and retrieved.                        - Post-polypectomy scar in the ascending colon.                        - The examination was otherwise normal on direct and                         retroflexion views. Recommendation:        - Discharge patient to home.                        - Resume previous diet.                        - Continue present medications.                        -  Await pathology results.                        - Repeat colonoscopy for surveillance based on                         pathology results.                        - Return to referring physician as previously                         scheduled. Procedure Code(s):     --- Professional ---                        [phone], Colonoscopy, flexible; with removal of                         tumor(s), polyp(s), or other lesion(s) by snare                         technique                        [id number], 59, Colonoscopy, flexible; with biopsy, single                         or multiple Diagnosis Code(s):     --- Professional ---                        D12.2, Benign neoplasm of ascending colon                        Z98.890, Other specified postprocedural states                        Z09, Encounter for follow-up examination after                          completed treatment for conditions other than                         malignant neoplasm                        Z86.010, Personal history of colonic polyps CPT copyright 2022 [employer]. All rights reserved. The codes documented in this report are preliminary and upon coder review may  be revised to meet current compliance requirements. Ole Schick MD, MD 09/23/2023 1:08:10 PM Number of Addenda: 0 Note Initiated On: 09/23/2023 12:21 PM Scope Withdrawal Time: 0 hours 14 minutes 1 second  Total Procedure Duration: 0 hours 21 minutes 16 seconds  Estimated Blood Loss:  Estimated blood loss was minimal.      [facility]

## 2023-09-23 NOTE — Interval H&P Note (Signed)
 History and Physical Interval Note:  09/23/2023 12:31 PM  Alexis Howe  has presented today for surgery, with the diagnosis of ADENOMATOUS,.  The various methods of treatment have been discussed with the patient and family. After consideration of risks, benefits and other options for treatment, the patient has consented to  Procedure(s): COLONOSCOPY (N/A) as a surgical intervention.  The patient's history has been reviewed, patient examined, no change in status, stable for surgery.  I have reviewed the patient's chart and labs.  Questions were answered to the patient's satisfaction.     Ole ONEIDA Schick  Ok to proceed with colonoscopy

## 2023-09-23 NOTE — Anesthesia Postprocedure Evaluation (Signed)
 Anesthesia Post Note  Patient: Alexis Howe  Procedure(s) Performed: COLONOSCOPY POLYPECTOMY, INTESTINE  Patient location during evaluation: PACU Anesthesia Type: General Level of consciousness: awake and alert Pain management: pain level controlled Vital Signs Assessment: post-procedure vital signs reviewed and stable Respiratory status: spontaneous breathing, nonlabored ventilation, respiratory function stable and patient connected to nasal cannula oxygen Cardiovascular status: blood pressure returned to baseline and stable Postop Assessment: no apparent nausea or vomiting Anesthetic complications: no   No notable events documented.   Last Vitals:  Vitals:   09/23/23 1146 09/23/23 1305  BP: 124/73 (!) (P) 111/53  Pulse: 93 (P) 79  Resp: 20 (P) 18  Temp: (!) 36.3 C   SpO2: 100% (P) 99%    Last Pain:  Vitals:   09/23/23 1146  TempSrc: Temporal  PainSc: 0-No pain                 Lynwood KANDICE Clause

## 2023-09-23 NOTE — Anesthesia Preprocedure Evaluation (Signed)
 Anesthesia Evaluation  Patient identified by MRN, date of birth, ID band Patient awake    Reviewed: Allergy & Precautions, H&P , NPO status , Patient's Chart, lab work & pertinent test results, reviewed documented beta blocker date and time   Airway Mallampati: II   Neck ROM: full    Dental  (+) Poor Dentition   Pulmonary neg pulmonary ROS   Pulmonary exam normal        Cardiovascular Exercise Tolerance: Good negative cardio ROS Normal cardiovascular exam Rhythm:regular Rate:Normal     Neuro/Psych negative neurological ROS  negative psych ROS   GI/Hepatic negative GI ROS, Neg liver ROS,,,  Endo/Other  negative endocrine ROS    Renal/GU negative Renal ROS  negative genitourinary   Musculoskeletal   Abdominal   Peds  Hematology negative hematology ROS (+)   Anesthesia Other Findings Past Medical History: No date: B12 deficiency 1985: Cancer (HCC)     Comment:  left breast mastectomy with reconstruction No date: Environmental allergies No date: Hyperlipidemia No date: Hypotension No date: Osteoporosis, post-menopausal No date: Vitamin D deficiency Past Surgical History: No date: APPENDECTOMY 1986: AUGMENTATION MAMMAPLASTY; Bilateral     Comment:  left complete mastectomy for breast ca. right               subcutaneous mastectomy for preventative. (orginal               nipple) 03/25/2023: COLONOSCOPY WITH PROPOFOL ; N/A     Comment:  Procedure: COLONOSCOPY WITH PROPOFOL ;  Surgeon:               Maryruth Ole DASEN, MD;  Location: [facility] ENDOSCOPY;                Service: Endoscopy;  Laterality: N/A; 03/25/2023: HEMOSTASIS CLIP PLACEMENT     Comment:  Procedure: CONTROL OF HEMORRHAGE, GI TRACT, ENDOSCOPIC,               BY CLIPPING OR OVERSEWING;  Surgeon: Maryruth Ole DASEN,              MD;  Location: [facility] ENDOSCOPY;  Service: Endoscopy;; 1985: MASTECTOMY; Left     Comment:  mastectomy with reconstructive  surgery 03/25/2023: POLYPECTOMY     Comment:  Procedure: POLYPECTOMY, INTESTINE;  Surgeon: Maryruth Ole DASEN, MD;  Location: [facility] ENDOSCOPY;  Service:               Endoscopy;; 03/25/2023: SUBMUCOSAL INJECTION     Comment:  Procedure: INJECTION, SUBMUCOSAL;  Surgeon: Maryruth Ole DASEN, MD;  Location: [facility] ENDOSCOPY;  Service:               Endoscopy;; No date: TUBAL LIGATION BMI    Body Mass Index: 19.95 kg/m     Reproductive/Obstetrics negative OB ROS                              Anesthesia Physical Anesthesia Plan  ASA: 2  Anesthesia Plan: General   Post-op Pain Management:    Induction:   PONV Risk Score and Plan:   Airway Management Planned:   Additional Equipment:   Intra-op Plan:   Post-operative Plan:   Informed Consent: I have reviewed the patients History and Physical, chart, labs and discussed the procedure including the risks, benefits and alternatives  for the proposed anesthesia with the patient or authorized representative who has indicated his/her understanding and acceptance.     Dental Advisory Given  Plan Discussed with: CRNA  Anesthesia Plan Comments:         Anesthesia Quick Evaluation

## 2023-09-23 NOTE — Transfer of Care (Signed)
 Immediate Anesthesia Transfer of Care Note  Patient: Alexis Howe  Procedure(s) Performed: COLONOSCOPY POLYPECTOMY, INTESTINE  Patient Location: Endoscopy Unit  Anesthesia Type:General  Level of Consciousness: awake  Airway & Oxygen Therapy: Patient Spontanous Breathing  Post-op Assessment: Report given to RN and Post -op Vital signs reviewed and stable  Post vital signs: Reviewed  Last Vitals:  Vitals Value Taken Time  BP 111/53   Temp    Pulse 87   Resp 15   SpO2 99     Last Pain:  Vitals:   09/23/23 1146  TempSrc: Temporal  PainSc: 0-No pain         Complications: No notable events documented.

## 2023-09-23 NOTE — H&P (Signed)
 Outpatient short stay form Pre-procedure 09/23/2023  Alexis ONEIDA Schick, MD  Primary Physician: Alexis Reyes BIRCH, MD  Reason for visit:  F/u EMR of polyp  History of present illness:    78 y/o lady with history of hyperlipidemia and b12 deficiency here for colonoscopy after EMR of large polyp 6 months ago. No blood thinners. No family history of GI malignancies. History of appendectomy.     Current Facility-Administered Medications:    0.9 %  sodium chloride  infusion, , Intravenous, Continuous, [place], Alexis ONEIDA, MD, Last Rate: 20 mL/hr at 09/23/23 1150, 500 mL at 09/23/23 1150  Medications Prior to Admission  Medication Sig Dispense Refill Last Dose/Taking   mupirocin  ointment (BACTROBAN ) 2 % Apply 1 application topically daily. 15 g 2 Past Week   raloxifene (EVISTA) 60 MG tablet Take 60 mg by mouth daily.   09/22/2023   vitamin B-12 (CYANOCOBALAMIN) 1000 MCG tablet Take 1,000 mcg by mouth daily.   Past Week   Vitamin D, Ergocalciferol, (DRISDOL) 1.25 MG (50000 UT) CAPS capsule Take 50,000 Units by mouth once a week.   Past Week   aspirin EC 81 MG tablet Take 81 mg by mouth daily. (Patient not taking: Reported on 09/23/2023)   Not Taking   lovastatin (MEVACOR) 40 MG tablet Take 40 mg by mouth daily. (Patient not taking: Reported on 09/23/2023)   Not Taking     No Known Allergies   Past Medical History:  Diagnosis Date   B12 deficiency    Cancer (HCC) 1985   left breast mastectomy with reconstruction   Environmental allergies    Hyperlipidemia    Hypotension    Osteoporosis, post-menopausal    Vitamin D deficiency     Review of systems:  Otherwise negative.    Physical Exam  Gen: Alert, oriented. Appears stated age.  HEENT: PERRLA. Lungs: No respiratory distress CV: RRR Abd: soft, benign, no masses Ext: No edema    Planned procedures: Proceed with colonoscopy. The patient understands the nature of the planned procedure, indications, risks, alternatives and  potential complications including but not limited to bleeding, infection, perforation, damage to internal organs and possible oversedation/side effects from anesthesia. The patient agrees and gives consent to proceed.  Please refer to procedure notes for findings, recommendations and patient disposition/instructions.     Alexis ONEIDA Schick, MD [facility]

## 2023-09-24 LAB — SURGICAL PATHOLOGY
